# Patient Record
Sex: Male | Born: 1975 | Race: White | Hispanic: No | Marital: Single | Smoking: Current every day smoker
Health system: Southern US, Community
[De-identification: ages and names within clinical notes are randomized; demographics above are authoritative.]

## PROBLEM LIST (undated history)

## (undated) DIAGNOSIS — G43909 Migraine, unspecified, not intractable, without status migrainosus: Secondary | ICD-10-CM

## (undated) HISTORY — DX: Migraine, unspecified, not intractable, without status migrainosus: G43.909

---

## 1999-07-30 ENCOUNTER — Emergency Department (HOSPITAL_COMMUNITY): Admission: EM | Admit: 1999-07-30 | Discharge: 1999-07-30 | Payer: Self-pay

## 2013-02-18 ENCOUNTER — Telehealth: Payer: Self-pay

## 2013-02-18 NOTE — Telephone Encounter (Signed)
PT IN NEED OF HIS MEDICAL RECORDS, HOPING THEY WILL BE READY BY THE BEGINNING OF NEXT WEEK. PLEASE CALL [phone]

## 2013-02-19 NOTE — Telephone Encounter (Signed)
Pt only need his immunization records.

## 2013-02-20 NOTE — Telephone Encounter (Signed)
Spoke to patient and he only had a tetanus shot TDAP faxed to [employer] his [employer].

## 2014-09-17 ENCOUNTER — Ambulatory Visit (INDEPENDENT_AMBULATORY_CARE_PROVIDER_SITE_OTHER): Payer: 59 | Admitting: Family Medicine

## 2014-09-17 VITALS — BP 122/88 | HR 72 | Temp 97.9°F | Resp 18 | Ht 68.25 in | Wt 223.6 lb

## 2014-09-17 DIAGNOSIS — R05 Cough: Secondary | ICD-10-CM | POA: Diagnosis not present

## 2014-09-17 DIAGNOSIS — R059 Cough, unspecified: Secondary | ICD-10-CM

## 2014-09-17 DIAGNOSIS — J209 Acute bronchitis, unspecified: Secondary | ICD-10-CM | POA: Diagnosis not present

## 2014-09-17 MED ORDER — AZITHROMYCIN 250 MG PO TABS: ORAL_TABLET | ORAL | Status: DC

## 2014-09-17 MED ORDER — PREDNISONE 20 MG PO TABS: ORAL_TABLET | ORAL | Status: DC

## 2014-09-17 MED ORDER — BENZONATATE 100 MG PO CAPS: 100.0000 mg | ORAL_CAPSULE | Freq: Three times a day (TID) | ORAL | Status: DC | PRN

## 2014-09-17 MED ORDER — ALBUTEROL SULFATE (2.5 MG/3ML) 0.083% IN NEBU
2.5000 mg | INHALATION_SOLUTION | Freq: Once | RESPIRATORY_TRACT | Status: AC
  Administered 2014-09-17: 2.5 mg via RESPIRATORY_TRACT

## 2014-09-17 MED ORDER — HYDROCODONE-HOMATROPINE 5-1.5 MG/5ML PO SYRP: 5.0000 mL | ORAL_SOLUTION | ORAL | Status: DC | PRN

## 2014-09-17 MED ORDER — ALBUTEROL SULFATE HFA 108 (90 BASE) MCG/ACT IN AERS: 2.0000 | INHALATION_SPRAY | RESPIRATORY_TRACT | Status: DC | PRN

## 2014-09-17 NOTE — Progress Notes (Signed)
Cough Subjective:  Patient ID: Dustin Richard, male    DOB: 1975/03/18  Age: 39 y.o. MRN: [id number]  A couple of weeks ago the patient developed a respiratory tract infection. He developed congestion then developed more cough. Over the last week the cough is persisted as a tight cough, sometimes productive of a faintly greenish sputum, times clear. He does not smoke. He works as a Water quality scientist at [employer]. He has not had to miss work. It did start after he had been in the room of the patient to have a cough, but she was wearing a mask. He no longer smokes. He does not have asthma history, except for one time in the past having a episode of problems lately was treated with albuterol inhaler it sounds like and did well. Otherwise he is generally healthy. He is not on any chronic medications.   Objective:   Pleasant gentleman, a little overweight. Heavily tattooed. His TMs are normal. Throat clear. Neck supple without nodes or thyromegaly. Chest is clear to auscultation but has poor air exchange. Forced expiration he has wheezing and congestion sounds. Peak flow was measured and he performed at 300 with a predicted maximum of about 580  Assessment & Plan:   Assessment:  Cough, bronchospasm secondary to infection/bronchitis  Plan:  Albuterol nebulizer treatment, then decide treatment  After the treatment he did not feel the pressure on his chest and was feeling like care was moving better. His repeat peak flow 360 was significantly improved.  Patient Instructions  Use albuterol 2 inhalations every 2-4 hours  Take the Tessalon cough pills one or 2 pills 3 times daily as needed for daytime cough  Take the Hycodan cough syrup at nighttime 1 teaspoon every 4 hours as needed for cough. This is sedating so I don't recommend it when you're going to work.  Take the azithromycin 2 pills initially, then 1 daily for 4 days  Take the prednisone 3 pills daily for 2 days, then 2 daily for 2 days,  then 1 daily for 2 days. Best taken in the morning.     HOPPER,DAVID, MD 09/17/2014

## 2014-09-17 NOTE — Patient Instructions (Signed)
Use albuterol 2 inhalations every 2-4 hours  Take the Tessalon cough pills one or 2 pills 3 times daily as needed for daytime cough  Take the Hycodan cough syrup at nighttime 1 teaspoon every 4 hours as needed for cough. This is sedating so I don't recommend it when you're going to work.  Take the azithromycin 2 pills initially, then 1 daily for 4 days  Take the prednisone 3 pills daily for 2 days, then 2 daily for 2 days, then 1 daily for 2 days. Best taken in the morning.

## 2014-12-01 ENCOUNTER — Ambulatory Visit (INDEPENDENT_AMBULATORY_CARE_PROVIDER_SITE_OTHER): Payer: 59 | Admitting: Family Medicine

## 2014-12-01 VITALS — BP 168/100 | HR 74 | Temp 98.0°F | Resp 16 | Ht 69.0 in | Wt 231.0 lb

## 2014-12-01 DIAGNOSIS — F17210 Nicotine dependence, cigarettes, uncomplicated: Secondary | ICD-10-CM

## 2014-12-01 DIAGNOSIS — I10 Essential (primary) hypertension: Secondary | ICD-10-CM

## 2014-12-01 LAB — POCT UA - MICROSCOPIC ONLY

## 2014-12-01 LAB — POCT URINALYSIS DIPSTICK
Glucose, UA: NEGATIVE
Leukocytes, UA: NEGATIVE
Nitrite, UA: NEGATIVE
PH UA: 7
PROTEIN UA: 30
RBC UA: NEGATIVE
SPEC GRAV UA: 1.02
UROBILINOGEN UA: 1

## 2014-12-01 LAB — POCT GLYCOSYLATED HEMOGLOBIN (HGB A1C): HEMOGLOBIN A1C: 5.7

## 2014-12-01 LAB — HEMOGLOBIN A1C: Hgb A1c MFr Bld: 5.7 % (ref 4.0–6.0)

## 2014-12-01 MED ORDER — LISINOPRIL-HYDROCHLOROTHIAZIDE 10-12.5 MG PO TABS: 1.0000 | ORAL_TABLET | Freq: Every day | ORAL | Status: DC

## 2014-12-01 NOTE — Patient Instructions (Signed)
Smoking Cessation, Tips for Success If you are ready to quit smoking, congratulations! You have chosen to help yourself be healthier. Cigarettes bring nicotine, tar, carbon monoxide, and other irritants into your body. Your lungs, heart, and blood vessels will be able to work better without these poisons. There are many different ways to quit smoking. Nicotine gum, nicotine patches, a nicotine inhaler, or nicotine nasal spray can help with physical craving. Hypnosis, support groups, and medicines help break the habit of smoking. WHAT THINGS CAN I DO TO MAKE QUITTING EASIER?  Here are some tips to help you quit for good:  Pick a date when you will quit smoking completely. Tell all of your friends and family about your plan to quit on that date.  Do not try to slowly cut down on the number of cigarettes you are smoking. Pick a quit date and quit smoking completely starting on that day.  Throw away all cigarettes.   Clean and remove all ashtrays from your home, work, and car.  On a card, write down your reasons for quitting. Carry the card with you and read it when you get the urge to smoke.  Cleanse your body of nicotine. Drink enough water and fluids to keep your urine clear or pale yellow. Do this after quitting to flush the nicotine from your body.  Learn to predict your moods. Do not let a bad situation be your excuse to have a cigarette. Some situations in your life might tempt you into wanting a cigarette.  Never have "just one" cigarette. It leads to wanting another and another. Remind yourself of your decision to quit.  Change habits associated with smoking. If you smoked while driving or when feeling stressed, try other activities to replace smoking. Stand up when drinking your coffee. Brush your teeth after eating. Sit in a different chair when you read the paper. Avoid alcohol while trying to quit, and try to drink fewer caffeinated beverages. Alcohol and caffeine may urge you to  smoke.  Avoid foods and drinks that can trigger a desire to smoke, such as sugary or spicy foods and alcohol.  Ask people who smoke not to smoke around you.  Have something planned to do right after eating or having a cup of coffee. For example, plan to take a walk or exercise.  Try a relaxation exercise to calm you down and decrease your stress. Remember, you may be tense and nervous for the first 2 weeks after you quit, but this will pass.  Find new activities to keep your hands busy. Play with a pen, coin, or rubber band. Doodle or draw things on paper.  Brush your teeth right after eating. This will help cut down on the craving for the taste of tobacco after meals. You can also try mouthwash.   Use oral substitutes in place of cigarettes. Try using lemon drops, carrots, cinnamon sticks, or chewing gum. Keep them handy so they are available when you have the urge to smoke.  When you have the urge to smoke, try deep breathing.  Designate your home as a nonsmoking area.  If you are a heavy smoker, ask your health care provider about a prescription for nicotine chewing gum. It can ease your withdrawal from nicotine.  Reward yourself. Set aside the cigarette money you save and buy yourself something nice.  Look for support from others. Join a support group or smoking cessation program. Ask someone at home or at work to help you with your plan   to quit smoking.  Always ask yourself, "Do I need this cigarette or is this just a reflex?" Tell yourself, "Today, I choose not to smoke," or "I do not want to smoke." You are reminding yourself of your decision to quit.  Do not replace cigarette smoking with electronic cigarettes (commonly called e-cigarettes). The safety of e-cigarettes is unknown, and some may contain harmful chemicals.  If you relapse, do not give up! Plan ahead and think about what you will do the next time you get the urge to smoke. HOW WILL I FEEL WHEN I QUIT SMOKING? You  may have symptoms of withdrawal because your body is used to nicotine (the addictive substance in cigarettes). You may crave cigarettes, be irritable, feel very hungry, cough often, get headaches, or have difficulty concentrating. The withdrawal symptoms are only temporary. They are strongest when you first quit but will go away within 10-14 days. When withdrawal symptoms occur, stay in control. Think about your reasons for quitting. Remind yourself that these are signs that your body is healing and getting used to being without cigarettes. Remember that withdrawal symptoms are easier to treat than the major diseases that smoking can cause.  Even after the withdrawal is over, expect periodic urges to smoke. However, these cravings are generally short lived and will go away whether you smoke or not. Do not smoke! WHAT RESOURCES ARE AVAILABLE TO HELP ME QUIT SMOKING? Your health care provider can direct you to community resources or hospitals for support, which may include:  Group support.  Education.  Hypnosis.  Therapy.   This information is not intended to replace advice given to you by your health care provider. Make sure you discuss any questions you have with your health care provider.   Document Released: 09/17/2003 Document Revised: 01/09/2014 Document Reviewed: 06/06/2012 Elsevier Interactive Patient Education 2016 Elsevier Inc. Hypertension Hypertension, commonly called high blood pressure, is when the force of blood pumping through your arteries is too strong. Your arteries are the blood vessels that carry blood from your heart throughout your body. A blood pressure reading consists of a higher number over a lower number, such as 110/72. The higher number (systolic) is the pressure inside your arteries when your heart pumps. The lower number (diastolic) is the pressure inside your arteries when your heart relaxes. Ideally you want your blood pressure below 120/80. Hypertension forces  your heart to work harder to pump blood. Your arteries may become narrow or stiff. Having untreated or uncontrolled hypertension can cause heart attack, stroke, kidney disease, and other problems. RISK FACTORS Some risk factors for high blood pressure are controllable. Others are not.  Risk factors you cannot control include:   Race. You may be at higher risk if you are African American.  Age. Risk increases with age.  Gender. Men are at higher risk than women before age 45 years. After age 65, women are at higher risk than men. Risk factors you can control include:  Not getting enough exercise or physical activity.  Being overweight.  Getting too much fat, sugar, calories, or salt in your diet.  Drinking too much alcohol. SIGNS AND SYMPTOMS Hypertension does not usually cause signs or symptoms. Extremely high blood pressure (hypertensive crisis) may cause headache, anxiety, shortness of breath, and nosebleed. DIAGNOSIS To check if you have hypertension, your health care provider will measure your blood pressure while you are seated, with your arm held at the level of your heart. It should be measured at least twice using   the same arm. Certain conditions can cause a difference in blood pressure between your right and left arms. A blood pressure reading that is higher than normal on one occasion does not mean that you need treatment. If it is not clear whether you have high blood pressure, you may be asked to return on a different day to have your blood pressure checked again. Or, you may be asked to monitor your blood pressure at home for 1 or more weeks. TREATMENT Treating high blood pressure includes making lifestyle changes and possibly taking medicine. Living a healthy lifestyle can help lower high blood pressure. You may need to change some of your habits. Lifestyle changes may include:  Following the DASH diet. This diet is high in fruits, vegetables, and whole grains. It is low in  salt, red meat, and added sugars.  Keep your sodium intake below 2,300 mg per day.  Getting at least 30-45 minutes of aerobic exercise at least 4 times per week.  Losing weight if necessary.  Not smoking.  Limiting alcoholic beverages.  Learning ways to reduce stress. Your health care provider may prescribe medicine if lifestyle changes are not enough to get your blood pressure under control, and if one of the following is true:  You are 7318-40 years of age and your systolic blood pressure is above 140.  You are 860 years of age or older, and your systolic blood pressure is above 150.  Your diastolic blood pressure is above 90.  You have diabetes, and your systolic blood pressure is over 140 or your diastolic blood pressure is over 90.  You have kidney disease and your blood pressure is above 140/90.  You have heart disease and your blood pressure is above 140/90. Your personal target blood pressure may vary depending on your medical conditions, your age, and other factors. HOME CARE INSTRUCTIONS  Have your blood pressure rechecked as directed by your health care provider.   Take medicines only as directed by your health care provider. Follow the directions carefully. Blood pressure medicines must be taken as prescribed. The medicine does not work as well when you skip doses. Skipping doses also puts you at risk for problems.  Do not smoke.   Monitor your blood pressure at home as directed by your health care provider. SEEK MEDICAL CARE IF:   You think you are having a reaction to medicines taken.  You have recurrent headaches or feel dizzy.  You have swelling in your ankles.  You have trouble with your vision. SEEK IMMEDIATE MEDICAL CARE IF:  You develop a severe headache or confusion.  You have unusual weakness, numbness, or feel faint.  You have severe chest or abdominal pain.  You vomit repeatedly.  You have trouble breathing. MAKE SURE YOU:   Understand  these instructions.  Will watch your condition.  Will get help right away if you are not doing well or get worse.   This information is not intended to replace advice given to you by your health care provider. Make sure you discuss any questions you have with your health care provider.   Document Released: 12/19/2004 Document Revised: 05/05/2014 Document Reviewed: 10/11/2012 Elsevier Interactive Patient Education 2016 Elsevier Inc. DASH Eating Plan DASH stands for "Dietary Approaches to Stop Hypertension." The DASH eating plan is a healthy eating plan that has been shown to reduce high blood pressure (hypertension). Additional health benefits may include reducing the risk of type 2 diabetes mellitus, heart disease, and stroke. The DASH eating  plan may also help with weight loss. WHAT DO I NEED TO KNOW ABOUT THE DASH EATING PLAN? For the DASH eating plan, you will follow these general guidelines:  Choose foods with a percent daily value for sodium of less than 5% (as listed on the food label).  Use salt-free seasonings or herbs instead of table salt or sea salt.  Check with your health care provider or pharmacist before using salt substitutes.  Eat lower-sodium products, often labeled as "lower sodium" or "no salt added."  Eat fresh foods.  Eat more vegetables, fruits, and low-fat dairy products.  Choose whole grains. Look for the word "whole" as the first word in the ingredient list.  Choose fish and skinless chicken or [place] more often than red meat. Limit fish, poultry, and meat to 6 oz (170 g) each day.  Limit sweets, desserts, sugars, and sugary drinks.  Choose heart-healthy fats.  Limit cheese to 1 oz (28 g) per day.  Eat more home-cooked food and less restaurant, buffet, and fast food.  Limit fried foods.  Cook foods using methods other than frying.  Limit canned vegetables. If you do use them, rinse them well to decrease the sodium.  When eating at a restaurant,  ask that your food be prepared with less salt, or no salt if possible. WHAT FOODS CAN I EAT? Seek help from a dietitian for individual calorie needs. Grains Whole grain or whole wheat bread. Brown rice. Whole grain or whole wheat pasta. Quinoa, bulgur, and whole grain cereals. Low-sodium cereals. Corn or whole wheat flour tortillas. Whole grain cornbread. Whole grain crackers. Low-sodium crackers. Vegetables Fresh or frozen vegetables (raw, steamed, roasted, or grilled). Low-sodium or reduced-sodium tomato and vegetable juices. Low-sodium or reduced-sodium tomato sauce and paste. Low-sodium or reduced-sodium canned vegetables.  Fruits All fresh, canned (in natural juice), or frozen fruits. Meat and Other Protein Products Ground beef (85% or leaner), grass-fed beef, or beef trimmed of fat. Skinless chicken or [place]. Ground chicken or [place]. Pork trimmed of fat. All fish and seafood. Eggs. Dried beans, peas, or lentils. Unsalted nuts and seeds. Unsalted canned beans. Dairy Low-fat dairy products, such as skim or 1% milk, 2% or reduced-fat cheeses, low-fat ricotta or cottage cheese, or plain low-fat yogurt. Low-sodium or reduced-sodium cheeses. Fats and Oils Tub margarines without trans fats. Light or reduced-fat mayonnaise and salad dressings (reduced sodium). Avocado. Safflower, olive, or canola oils. Natural peanut or almond butter. Other Unsalted popcorn and pretzels. The items listed above may not be a complete list of recommended foods or beverages. Contact your dietitian for more options. WHAT FOODS ARE NOT RECOMMENDED? Grains White bread. White pasta. White rice. Refined cornbread. Bagels and croissants. Crackers that contain trans fat. Vegetables Creamed or fried vegetables. Vegetables in a cheese sauce. Regular canned vegetables. Regular canned tomato sauce and paste. Regular tomato and vegetable juices. Fruits Dried fruits. Canned fruit in light or heavy syrup. Fruit juice. Meat  and Other Protein Products Fatty cuts of meat. Ribs, chicken wings, bacon, sausage, bologna, salami, chitterlings, fatback, hot dogs, bratwurst, and packaged luncheon meats. Salted nuts and seeds. Canned beans with salt. Dairy Whole or 2% milk, cream, half-and-half, and cream cheese. Whole-fat or sweetened yogurt. Full-fat cheeses or blue cheese. Nondairy creamers and whipped toppings. Processed cheese, cheese spreads, or cheese curds. Condiments Onion and garlic salt, seasoned salt, table salt, and sea salt. Canned and packaged gravies. Worcestershire sauce. Tartar sauce. Barbecue sauce. Teriyaki sauce. Soy sauce, including reduced sodium. Steak sauce. Fish sauce.  Oyster sauce. Cocktail sauce. Horseradish. Ketchup and mustard. Meat flavorings and tenderizers. Bouillon cubes. Hot sauce. Tabasco sauce. Marinades. Taco seasonings. Relishes. Fats and Oils Butter, stick margarine, lard, shortening, ghee, and bacon fat. Coconut, palm kernel, or palm oils. Regular salad dressings. Other Pickles and olives. Salted popcorn and pretzels. The items listed above may not be a complete list of foods and beverages to avoid. Contact your dietitian for more information. WHERE CAN I FIND MORE INFORMATION? National Heart, Lung, and Blood Institute: [employer]   This information is not intended to replace advice given to you by your health care provider. Make sure you discuss any questions you have with your health care provider.   Document Released: 12/08/2010 Document Revised: 01/09/2014 Document Reviewed: 10/23/2012 Elsevier Interactive Patient Education [employer]

## 2014-12-01 NOTE — Progress Notes (Signed)
[facility] [address] [place] [address] [phone]- 0000  Date:  12/01/2014   Name:  Dustin Richard   DOB:  1975/09/13   MRN:  [id number]  PCP:  No primary care provider on file.    Chief Complaint: Hypertension   History of Present Illness:  Dustin Richard is a 39 y.o. 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  - Taking pseudophed several times a day for URI.  - Does snore. Does not know if apneic. No sleep study in the past.   Diet:  - 5-6 30oz waters a day for health more than thirst.  Thirsty - Sweet tea, powerade, gatorade 1x/day - Minimal caffeine - Trying to follow diet from a personal trainer.  Lots of vegetables and protein.  --lost 40 lbs since that point.   - 2 days a week, 2-3 hours per week, HIIT.   Currently smoking <1ppw.  Previously a heavy smoker.   There are no active problems to display for this patient.   Past Medical History  Diagnosis Date  . Migraine     History reviewed. No pertinent past surgical history.  Social History  Substance Use Topics  . Smoking status: Current Some Day Smoker  . Smokeless tobacco: None  . Alcohol Use: 2.4 - 4.2 oz/week    4-7 Standard drinks or equivalent per week    Family History  Problem Relation Age of Onset  . Diabetes Father   . Hyperlipidemia Father   . Cancer Maternal Grandmother   . Heart disease Maternal Grandfather   . Heart disease Paternal Grandmother   . Hyperlipidemia Paternal Grandmother     No Known Allergies  Medication list has been reviewed and updated.  Current Outpatient Prescriptions on File Prior to Visit  Medication Sig Dispense Refill  . albuterol (PROVENTIL HFA;VENTOLIN HFA) 108 (90 BASE) MCG/ACT inhaler Inhale 2 puffs into the lungs every 4 (four) hours as needed for  wheezing or shortness of breath (cough, shortness of breath or wheezing.). (Patient not taking: Reported on 12/01/2014) 1 Inhaler 1  . HYDROcodone-homatropine (HYCODAN) 5-1.5 MG/5ML syrup Take 5 mLs by mouth every 4 (four) hours as needed. (Patient not taking: Reported on 12/01/2014) 120 mL 0   No current facility-administered medications on file prior to visit.    Review of Systems:  Review of Systems  Constitutional: Positive for diaphoresis. Negative for fever, chills and malaise/fatigue.  Eyes: Negative for blurred vision.  Respiratory: Positive for hemoptysis. Negative for cough and shortness of breath.   Cardiovascular: Negative for chest pain and palpitations.  Gastrointestinal: Negative for nausea, vomiting, abdominal pain, diarrhea and constipation.  Musculoskeletal: Negative for myalgias and back pain.  Neurological: Positive for dizziness (standing).  Psychiatric/Behavioral: Negative for depression.    Physical Examination: Filed Vitals:   12/01/14 1624  BP: 168/100  Pulse: 74  Temp: 98 F (36.7 C)  Resp: 16   Filed Vitals:   12/01/14 1624  Height:  (1.753 m)  Weight: 231 lb (104.781 kg)   Body mass index is 34.1 kg/(m^2). Ideal Body Weight: Weight in (lb) to have BMI = 25: 168.9  GEN: WDWN, NAD, Non-toxic, A & O x 3 HEENT: Atraumatic, Normocephalic. Neck supple. No masses, No LAD. Ears and Nose: No external deformity. CV: RRR,  No M/G/R. No JVD. No thrill. No extra heart sounds. PULM: CTA B, no wheezes, crackles, rhonchi. No retractions. No resp. distress. No accessory muscle use. ABD: S, NT, ND, +BS. No rebound. No HSM. EXTR: No c/c/e NEURO Normal gait.  PSYCH: Normally interactive. Conversant. Not depressed or anxious appearing.  Calm demeanor.   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 Does have a family history of diabetes and heart disease as well as hyperlipidemia. Additionally he is a smoker, although he has cut back  quite a bit. He has been working out over the last several months and has lost  forty pounds last year. - A1 C 5.7 - Urine dip was unremarkable. - CMP and Lipid Panel sent. - Considering his relatively high BP westarted him on low dose losartan and HCTZ. Hewill continue to monitor his blood pressure. while at work & Come back for a follow-up in 2 weeks. - He was also provided a DASH diet handout.   Current Every day smoker: Discussed quitting and offered pharmaceutical aid or referral for CBT. He will follow up as needed.  He is sexually active and we decided to HIV screening.  Signed Guinevere ScarletBlake Ronnae Kaser, MD

## 2014-12-02 LAB — COMPREHENSIVE METABOLIC PANEL
ALK PHOS: 48 U/L (ref 40–115)
ALT: 54 U/L — AB (ref 9–46)
AST: 53 U/L — AB (ref 10–40)
Albumin: 4.7 g/dL (ref 3.6–5.1)
BILIRUBIN TOTAL: 1.1 mg/dL (ref 0.2–1.2)
BUN: 10 mg/dL (ref 7–25)
CALCIUM: 9.1 mg/dL (ref 8.6–10.3)
CO2: 28 mmol/L (ref 20–31)
Chloride: 100 mmol/L (ref 98–110)
Creat: 0.89 mg/dL (ref 0.60–1.35)
GLUCOSE: 103 mg/dL — AB (ref 65–99)
Potassium: 4.2 mmol/L (ref 3.5–5.3)
Sodium: 135 mmol/L (ref 135–146)
Total Protein: 7.1 g/dL (ref 6.1–8.1)

## 2014-12-02 LAB — HIV ANTIBODY (ROUTINE TESTING W REFLEX): HIV 1&2 Ab, 4th Generation: NONREACTIVE

## 2014-12-02 LAB — LIPID PANEL
CHOLESTEROL: 164 mg/dL (ref 125–200)
HDL: 56 mg/dL (ref >=40)
LDL Cholesterol: 85 mg/dL (ref <130)
Total CHOL/HDL Ratio: 2.9 Ratio (ref <=5.0)
Triglycerides: 117 mg/dL (ref <150)
VLDL: 23 mg/dL (ref <30)

## 2014-12-17 ENCOUNTER — Encounter: Payer: Self-pay | Admitting: Family Medicine

## 2015-01-06 MED FILL — LISINOPRIL-HCTZ 10-12.5 MG: 10-12.5 | 30 days supply | Qty: 30 | Fill #1

## 2015-02-10 ENCOUNTER — Ambulatory Visit (INDEPENDENT_AMBULATORY_CARE_PROVIDER_SITE_OTHER): Payer: 59 | Admitting: Physician Assistant

## 2015-02-10 VITALS — BP 142/92 | HR 91 | Temp 97.9°F | Resp 16 | Ht 68.0 in | Wt 219.0 lb

## 2015-02-10 DIAGNOSIS — R7303 Prediabetes: Secondary | ICD-10-CM

## 2015-02-10 DIAGNOSIS — R7309 Other abnormal glucose: Secondary | ICD-10-CM | POA: Diagnosis not present

## 2015-02-10 DIAGNOSIS — I10 Essential (primary) hypertension: Secondary | ICD-10-CM | POA: Diagnosis not present

## 2015-02-10 LAB — COMPLETE METABOLIC PANEL WITH GFR
ALT: 47 U/L — AB (ref 9–46)
AST: 48 U/L — AB (ref 10–40)
Albumin: 4.1 g/dL (ref 3.6–5.1)
Alkaline Phosphatase: 49 U/L (ref 40–115)
BUN: 9 mg/dL (ref 7–25)
CALCIUM: 9.3 mg/dL (ref 8.6–10.3)
CHLORIDE: 103 mmol/L (ref 98–110)
CO2: 25 mmol/L (ref 20–31)
CREATININE: 0.93 mg/dL (ref 0.60–1.35)
GFR, Est African American: >89 mL/min (ref >=60)
GFR, Est Non African American: >89 mL/min (ref >=60)
GLUCOSE: 94 mg/dL (ref 65–99)
POTASSIUM: 4.1 mmol/L (ref 3.5–5.3)
SODIUM: 139 mmol/L (ref 135–146)
Total Bilirubin: 1.1 mg/dL (ref 0.2–1.2)
Total Protein: 7.2 g/dL (ref 6.1–8.1)

## 2015-02-10 MED ORDER — LISINOPRIL-HYDROCHLOROTHIAZIDE 20-12.5 MG PO TABS: 1.0000 | ORAL_TABLET | Freq: Every day | ORAL | Status: DC

## 2015-02-10 MED FILL — LISINOPRIL-HCTZ 20-12.5 MG: 20-12.5 | 30 days supply | Qty: 30 | Fill #0

## 2015-02-10 NOTE — Patient Instructions (Addendum)
We are increasing your blood pressure medication today.   If this does not stay beneath the 140/90, we need to see you within a month. If this does, then I would like you to return in 6 months.  Keep up with your exercise, hydration, and appropriate diet.  You are doing awesome. I will have your lab results within 10 days.   You Can Quit Smoking You can contact the 1.800.QUI.TNOW  --THEY WILL OFFER DIFFERENT MODES TO QUIT SMOKING TO HELP START THE PROCESS.  If you are ready to quit smoking or are thinking about it, congratulations! You have chosen to help yourself be healthier and live longer! There are lots of different ways to quit smoking. Nicotine gum, nicotine patches, a nicotine inhaler, or nicotine nasal spray can help with physical craving. Hypnosis, support groups, and medicines help break the habit of smoking. TIPS TO GET OFF AND STAY OFF CIGARETTES  Learn to predict your moods. Do not let a bad situation be your excuse to have a cigarette. Some situations in your life might tempt you to have a cigarette.  Ask friends and co-workers not to smoke around you.  Make your home smoke-free.  Never have "just one" cigarette. It leads to wanting another and another. Remind yourself of your decision to quit.  On a card, make a list of your reasons for not smoking. Read it at least the same number of times a day as you have a cigarette. Tell yourself everyday, "I do not want to smoke. I choose not to smoke."  Ask someone at home or work to help you with your plan to quit smoking.  Have something planned after you eat or have a cup of coffee. Take a walk or get other exercise to perk you up. This will help to keep you from overeating.  Try a relaxation exercise to calm you down and decrease your stress. Remember, you may be tense and nervous the first two weeks after you quit. This will pass.  Find new activities to keep your hands busy. Play with a pen, coin, or rubber band. Doodle or  draw things on paper.  Brush your teeth right after eating. This will help cut down the craving for the taste of tobacco after meals. You can try mouthwash too.  Try gum, breath mints, or diet candy to keep something in your mouth. IF YOU SMOKE AND WANT TO QUIT:  Do not stock up on cigarettes. Never buy a carton. Wait until one pack is finished before you buy another.  Never carry cigarettes with you at work or at home.  Keep cigarettes as far away from you as possible. Leave them with someone else.  Never carry matches or a lighter with you.  Ask yourself, "Do I need this cigarette or is this just a reflex?"  Bet with someone that you can quit. Put cigarette money in a piggy bank every morning. If you smoke, you give up the money. If you do not smoke, by the end of the week, you keep the money.  Keep trying. It takes 21 days to change a habit!  Talk to your doctor about using medicines to help you quit. These include nicotine replacement gum, lozenges, or skin patches.   This information is not intended to replace advice given to you by your health care provider. Make sure you discuss any questions you have with your health care provider.   Document Released: 10/15/2008 Document Revised: 03/13/2011 Document Reviewed: 10/15/2008 Elsevier  Interactive Patient Education 2016 Elsevier Inc.  

## 2015-02-10 NOTE — Progress Notes (Signed)
[facility] [address] [place] [address] [phone]  Date:  02/10/2015   Name:  Dustin Richard   DOB:  [birth year]   MRN:  [id number]  PCP:  No primary care provider on file.    History of Present Illness:  Dustin Richard is a 40 y.o. male patient who presents to [facility] for medication recheck, and htn.    He takes it every morning at 8 am.  He has not missed any dosing.   His blood pressure has been 142/92.   Blood pressure has elevated to 160 systolic at work twice.    He is eating baked chicken, vegetables, grain.  Eats lean ground [place].  Exercising well. Continues to smoke.     There are no active problems to display for this patient.   Past Medical History  Diagnosis Date  . Migraine     History reviewed. No pertinent past surgical history.  Social History  Substance Use Topics  . Smoking status: Current Some Day Smoker  . Smokeless tobacco: Never Used  . Alcohol Use: 2.4 - 4.2 oz/week    4-7 Standard drinks or equivalent per week    Family History  Problem Relation Age of Onset  . Diabetes Father   . Hyperlipidemia Father   . Cancer Maternal Grandmother   . Heart disease Maternal Grandfather   . Heart disease Paternal Grandmother   . Hyperlipidemia Paternal Grandmother     No Known Allergies  Medication list has been reviewed and updated.  Current Outpatient Prescriptions on File Prior to Visit  Medication Sig Dispense Refill  . albuterol (PROVENTIL HFA;VENTOLIN HFA) 108 (90 BASE) MCG/ACT inhaler Inhale 2 puffs into the lungs every 4 (four) hours as needed for wheezing or shortness of breath (cough, shortness of breath or wheezing.). 1 Inhaler 1  . lisinopril-hydrochlorothiazide (ZESTORETIC) 10-12.5 MG tablet Take 1 tablet by mouth daily. 30 tablet 1   No current facility-administered medications on file prior to visit.    ROS ROS otherwise unremarkable unless listed above.  Physical Examination: BP 142/92 mmHg  Pulse  91  Temp(Src) 97.9 F (36.6 C) (Oral)  Resp 16  Ht  (1.727 m)  Wt 219 lb (99.338 kg)  BMI 33.31 kg/m2  SpO2 96% Ideal Body Weight: Weight in (lb) to have BMI = 25: 164.1 Wt Readings from Last 3 Encounters:  02/10/15 219 lb (99.338 kg)  12/01/14 231 lb (104.781 kg)  09/17/14 223 lb 9.6 oz (101.424 kg)    Physical Exam  Constitutional: He is oriented to person, place, and time. He appears well-developed and well-nourished. No distress.  HENT:  Head: Normocephalic and atraumatic.  Eyes: Conjunctivae and EOM are normal. Pupils are equal, round, and reactive to light.  Cardiovascular: Normal rate and regular rhythm.  Exam reveals no gallop and no friction rub.   No murmur heard. Pulses:      Carotid pulses are 2+ on the right side, and 2+ on the left side.      Radial pulses are 2+ on the right side, and 2+ on the left side.       Dorsalis pedis pulses are 2+ on the right side, and 2+ on the left side.  Pulmonary/Chest: Effort normal. No respiratory distress.  Neurological: He is alert and oriented to person, place, and time.  Skin: Skin is warm and dry. He is not diaphoretic.  [facility]iatric: [facility] and affect. His behavior is normal.  Results for orders placed or performed in visit on 02/10/15  COMPLETE METABOLIC PANEL WITH GFR  Result Value Ref Range   Sodium 139 135 - 146 mmol/L   Potassium 4.1 3.5 - 5.3 mmol/L   Chloride 103 98 - 110 mmol/L   CO2 25 20 - 31 mmol/L   Glucose, Bld 94 65 - 99 mg/dL   BUN 9 7 - 25 mg/dL   Creat 1.61 0.96 - 0.45 mg/dL   Total Bilirubin 1.1 0.2 - 1.2 mg/dL   Alkaline Phosphatase 49 40 - 115 U/L   AST 48 (H) 10 - 40 U/L   ALT 47 (H) 9 - 46 U/L   Total Protein 7.2 6.1 - 8.1 g/dL   Albumin 4.1 3.6 - 5.1 g/dL   Calcium 9.3 8.6 - 40.9 mg/dL   GFR, Est African American >89 >=60 mL/min   GFR, Est Non African American >89 >=60 mL/min  Hemoglobin A1c  Result Value Ref Range   Hgb A1c MFr Bld 5.8 (H) <5.7 %   Mean Plasma  Glucose 120 (H) <117 mg/dL     Assessment and Plan: Dustin Richard is a 40 y.o. male who is here today for elevated HTN, and a1c check. rediabetic at this time.  Ace-I covered.  Increasing bp med to 20 from 10 of the lisinopril.   If this does not stay beneath the 140/90, return in 1 month If this does, then I would like you to return in 6 months.  advised exercise, hydration, and appropriate diet.  Advised smoking cessation. I will have your lab results within 10 days.  Essential hypertension - Plan: lisinopril-hydrochlorothiazide (PRINZIDE,ZESTORETIC) 20-12.5 MG tablet, COMPLETE METABOLIC PANEL WITH GFR  Prediabetes - Plan: Hemoglobin A1c  Trena Platt, PA-C [facility] 2/10/20176:54 AM

## 2015-02-11 LAB — HEMOGLOBIN A1C
Hgb A1c MFr Bld: 5.8 % — ABNORMAL HIGH (ref <5.7)
Mean Plasma Glucose: 120 mg/dL — ABNORMAL HIGH (ref <117)

## 2015-03-12 MED FILL — LISINOPRIL-HCTZ 20-12.5 MG: 20-12.5 | 90 days supply | Qty: 90 | Fill #1

## 2015-04-09 ENCOUNTER — Encounter: Payer: Self-pay | Admitting: *Deleted

## 2015-04-09 ENCOUNTER — Ambulatory Visit (INDEPENDENT_AMBULATORY_CARE_PROVIDER_SITE_OTHER): Payer: 59 | Admitting: Physician Assistant

## 2015-04-09 VITALS — BP 132/80 | HR 87 | Temp 98.0°F | Resp 16 | Ht 68.0 in | Wt 210.0 lb

## 2015-04-09 DIAGNOSIS — F411 Generalized anxiety disorder: Secondary | ICD-10-CM | POA: Diagnosis not present

## 2015-04-09 DIAGNOSIS — R251 Tremor, unspecified: Secondary | ICD-10-CM

## 2015-04-09 DIAGNOSIS — F419 Anxiety disorder, unspecified: Secondary | ICD-10-CM | POA: Diagnosis not present

## 2015-04-09 LAB — TSH: TSH: 1.56 mIU/L (ref 0.40–4.50)

## 2015-04-09 MED ORDER — HYDROXYZINE HCL 25 MG PO TABS: 25.0000 mg | ORAL_TABLET | Freq: Three times a day (TID) | ORAL | Status: DC | PRN

## 2015-04-09 MED ORDER — PROPRANOLOL HCL ER 60 MG PO CP24: 60.0000 mg | ORAL_CAPSULE | Freq: Every day | ORAL | Status: DC

## 2015-04-09 MED FILL — hydrOXYzine HCL 25 MG TABS: 25 | 10 days supply | Qty: 120 | Fill #0

## 2015-04-09 MED FILL — PROPRANOLOL ER 60 MG CAP: 60 | 30 days supply | Qty: 30 | Fill #0

## 2015-04-09 NOTE — Patient Instructions (Addendum)
IF you received an x-ray today, you will receive an invoice from [facility]. Please contact [facility] at [phone] with questions or concerns regarding your invoice.   IF you received labwork today, you will receive an invoice from [employer]. Please contact [facility] at [phone] with questions or concerns regarding your invoice.   Our billing staff will not be able to assist you with questions regarding bills from these companies.  You will be contacted with the lab results as soon as they are available. The fastest way to get your results is to activate your My Chart account. Instructions are located on the last page of this paperwork. If you have not heard from [place] regarding the results in 2 weeks, please contact this office.    Please take the propranolol daily.  We will recheck this on Sunday.  You can do 1-4 tablets three times per day as needed.  Please read the side effects.   Hydroxyzine capsules or tablets What is this medicine? HYDROXYZINE (hye DROX i zeen) is an antihistamine. This medicine is used to treat allergy symptoms. It is also used to treat anxiety and tension. This medicine can be used with other medicines to induce sleep before surgery. This medicine may be used for other purposes; ask your health care provider or pharmacist if you have questions. What should I tell my health care provider before I take this medicine? They need to know if you have any of these conditions: -any chronic illness -difficulty passing urine -glaucoma -heart disease -kidney disease -liver disease -lung disease -an unusual or allergic reaction to hydroxyzine, cetirizine, other medicines, foods, dyes, or preservatives -pregnant or trying to get pregnant -breast-feeding How should I use this medicine? Take this medicine by mouth with a full glass of water. Follow the directions on the prescription label. You may take this medicine  with food or on an empty stomach. Take your medicine at regular intervals. Do not take your medicine more often than directed. Talk to your pediatrician regarding the use of this medicine in children. Special care may be needed. While this drug may be prescribed for children as young as 66 years of age for selected conditions, precautions do apply. Patients over 15 years old may have a stronger reaction and need a smaller dose. Overdosage: If you think you have taken too much of this medicine contact a poison control center or emergency room at once. NOTE: This medicine is only for you. Do not share this medicine with others. What if I miss a dose? If you miss a dose, take it as soon as you can. If it is almost time for your next dose, take only that dose. Do not take double or extra doses. What may interact with this medicine? -alcohol -barbiturate medicines for sleep or seizures -medicines for colds, allergies -medicines for depression, anxiety, or emotional disturbances -medicines for pain -medicines for sleep -muscle relaxants This list may not describe all possible interactions. Give your health care provider a list of all the medicines, herbs, non-prescription drugs, or dietary supplements you use. Also tell them if you smoke, drink alcohol, or use illegal drugs. Some items may interact with your medicine. What should I watch for while using this medicine? Tell your doctor or health care professional if your symptoms do not improve. You may get drowsy or dizzy. Do not drive, use machinery, or do anything that needs mental alertness until you know how this medicine affects you. Do  not stand or sit up quickly, especially if you are an older patient. This reduces the risk of dizzy or fainting spells. Alcohol may interfere with the effect of this medicine. Avoid alcoholic drinks. Your mouth may get dry. Chewing sugarless gum or sucking hard candy, and drinking plenty of water may help. Contact  your doctor if the problem does not go away or is severe. This medicine may cause dry eyes and blurred vision. If you wear contact lenses you may feel some discomfort. Lubricating drops may help. See your eye doctor if the problem does not go away or is severe. If you are receiving skin tests for allergies, tell your doctor you are using this medicine. What side effects may I notice from receiving this medicine? Side effects that you should report to your doctor or health care professional as soon as possible: -fast or irregular heartbeat -difficulty passing urine -seizures -slurred speech or confusion -tremor Side effects that usually do not require medical attention (report to your doctor or health care professional if they continue or are bothersome): -constipation -drowsiness -fatigue -headache -stomach upset This list may not describe all possible side effects. Call your doctor for medical advice about side effects. You may report side effects to FDA at 1-800-FDA-1088. Where should I keep my medicine? Keep out of the reach of children. Store at room temperature between 15 and 30 degrees C (59 and 86 degrees F). Keep container tightly closed. Throw away any unused medicine after the expiration date. NOTE: This sheet is a summary. It may not cover all possible information. If you have questions about this medicine, talk to your doctor, pharmacist, or health care provider.    2016, Elsevier/Gold Standard. (2007-05-03 14:50:59) Propranolol tablets What is this medicine? PROPRANOLOL (proe PRAN oh lole) is a beta-blocker. Beta-blockers reduce the workload on the heart and help it to beat more regularly. This medicine is used to treat high blood pressure, to control irregular heart rhythms (arrhythmias) and to relieve chest pain caused by angina. It may also be helpful after a heart attack. This medicine is also used to prevent migraine headaches, relieve uncontrollable shaking (tremors), and  help certain problems related to the thyroid gland and adrenal gland. This medicine may be used for other purposes; ask your health care provider or pharmacist if you have questions. What should I tell my health care provider before I take this medicine? They need to know if you have any of these conditions: -circulation problems or blood vessel disease -diabetes -history of heart attack or heart disease, vasospastic angina -kidney disease -liver disease -lung or breathing disease, like asthma or emphysema -pheochromocytoma -slow heart rate -thyroid disease -an unusual or allergic reaction to propranolol, other beta-blockers, medicines, foods, dyes, or preservatives -pregnant or trying to get pregnant -breast-feeding How should I use this medicine? Take this medicine by mouth with a glass of water. Follow the directions on the prescription label. Take your doses at regular intervals. Do not take your medicine more often than directed. Do not stop taking except on your the advice of your doctor or health care professional. Talk to your pediatrician regarding the use of this medicine in children. Special care may be needed. Overdosage: If you think you have taken too much of this medicine contact a poison control center or emergency room at once. NOTE: This medicine is only for you. Do not share this medicine with others. What if I miss a dose? If you miss a dose, take it as soon  as you can. If it is almost time for your next dose, take only that dose. Do not take double or extra doses. What may interact with this medicine? Do not take this medicine with any of the following medications: -feverfew -phenothiazines like chlorpromazine, mesoridazine, prochlorperazine, thioridazine This medicine may also interact with the following medications: -aluminum hydroxide gel -antipyrine -antiviral medicines for HIV or AIDS -barbiturates like phenobarbital -certain medicines for blood pressure,  heart disease, irregular heart beat -cimetidine -ciprofloxacin -diazepam -fluconazole -haloperidol -isoniazid -medicines for cholesterol like cholestyramine or colestipol -medicines for mental depression -medicines for migraine headache like almotriptan, eletriptan, frovatriptan, naratriptan, rizatriptan, sumatriptan, zolmitriptan -NSAIDs, medicines for pain and inflammation, like ibuprofen or naproxen -phenytoin -rifampin -teniposide -theophylline -thyroid medicines -tolbutamide -warfarin -zileuton This list may not describe all possible interactions. Give your health care provider a list of all the medicines, herbs, non-prescription drugs, or dietary supplements you use. Also tell them if you smoke, drink alcohol, or use illegal drugs. Some items may interact with your medicine. What should I watch for while using this medicine? Visit your doctor or health care professional for regular check ups. Check your blood pressure and pulse rate regularly. Ask your health care professional what your blood pressure and pulse rate should be, and when you should contact them. You may get drowsy or dizzy. Do not drive, use machinery, or do anything that needs mental alertness until you know how this drug affects you. Do not stand or sit up quickly, especially if you are an older patient. This reduces the risk of dizzy or fainting spells. Alcohol can make you more drowsy and dizzy. Avoid alcoholic drinks. This medicine can affect blood sugar levels. If you have diabetes, check with your doctor or health care professional before you change your diet or the dose of your diabetic medicine. Do not treat yourself for coughs, colds, or pain while you are taking this medicine without asking your doctor or health care professional for advice. Some ingredients may increase your blood pressure. What side effects may I notice from receiving this medicine? Side effects that you should report to your doctor or  health care professional as soon as possible: -allergic reactions like skin rash, itching or hives, swelling of the face, lips, or tongue -breathing problems -changes in blood sugar -cold hands or feet -difficulty sleeping, nightmares -dry peeling skin -hallucinations -muscle cramps or weakness -slow heart rate -swelling of the legs and ankles -vomiting Side effects that usually do not require medical attention (report to your doctor or health care professional if they continue or are bothersome): -change in sex drive or performance -diarrhea -dry sore eyes -hair loss -nausea -weak or tired This list may not describe all possible side effects. Call your doctor for medical advice about side effects. You may report side effects to FDA at 1-800-FDA-1088. Where should I keep my medicine? Keep out of the reach of children. Store at room temperature between 15 and 30 degrees C (59 and 86 degrees F). Protect from light. Throw away any unused medicine after the expiration date. NOTE: This sheet is a summary. It may not cover all possible information. If you have questions about this medicine, talk to your doctor, pharmacist, or health care provider.    2016, Elsevier/Gold Standard. (2012-08-23 14:51:53)

## 2015-04-11 NOTE — Progress Notes (Signed)
[facility] [address] [place] [address] [phone]  Date:  04/09/2015   Name:  Dustin Richard   DOB:  [birth year]   MRN:  [id number]  PCP:  No primary care provider on file.    History of Present Illness:  Dustin Richard is a 40 y.o. male patient who presents to [facility] for cc of tremulousness.  Patient reports a couple months of tremulousness.  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  In the last week, he had to let someone else draw the blood, due to hsi tremuloousness.  This is intermittent, and may last 20-25 minutes.  Generally happens at work.  It is uncontrolled and worsens with activity.   He has a hx of anxiety, and notes that he feels very anxious at his work.  Especially when his managers come to visit him.  He reports that it is high intensity/volume, and he is increasingly nervous at work.  He generally is not anxious with introduction to new patients.  His anxiety will include hot, sweaty palms.  He has had one of episode of dizziness associated.  He is currently taking bp medicine.   He denies depressed mood of loss of interest, appetite, guilt, si/hi, or sleep issues.   Currently lives at home, fairly quiet and stress free--with parents.   etOH use is about 4-5 beers and 4-5 shots, 2 days a week (saturday and Sunday) He notes improvement with this since last year.  He was drinking heavily almost daily.  He states that he chose that he needed to slow down. No heavy caffeine use.   No cannabis use or illicit drug use.     There are no active problems to display for this patient.   Past Medical History  Diagnosis Date  . Migraine     History reviewed. No pertinent past surgical history.  Social History  Substance Use Topics  . Smoking status: Current Some Day Smoker  . Smokeless tobacco: Never Used  . Alcohol Use: 2.4 - 4.2 oz/week    4-7 Standard drinks or equivalent per week    Family  History  Problem Relation Age of Onset  . Diabetes Father   . Hyperlipidemia Father   . Cancer Maternal Grandmother   . Heart disease Maternal Grandfather   . Heart disease Paternal Grandmother   . Hyperlipidemia Paternal Grandmother     No Known Allergies  Medication list has been reviewed and updated.  Current Outpatient Prescriptions on File Prior to Visit  Medication Sig Dispense Refill  . albuterol (PROVENTIL HFA;VENTOLIN HFA) 108 (90 BASE) MCG/ACT inhaler Inhale 2 puffs into the lungs every 4 (four) hours as needed for wheezing or shortness of breath (cough, shortness of breath or wheezing.). 1 Inhaler 1  . lisinopril-hydrochlorothiazide (PRINZIDE,ZESTORETIC) 20-12.5 MG tablet Take 1 tablet by mouth daily. 30 tablet 5   No current facility-administered medications on file prior to visit.    ROS ROS otherwise unremarkable unless listed above.   Physical Examination: BP 132/80 mmHg  Pulse 87  Temp(Src) 98 F (36.7 C) (Oral)  Resp 16  Ht  (1.727 m)  Wt 210 lb (95.255 kg)  BMI 31.94 kg/m2  SpO2 98% Ideal Body Weight: Weight in (lb) to have BMI = 25: 164.1  Physical Exam  Constitutional: He is oriented to person, place, and time. He appears well-developed and well-nourished. No distress.  HENT:  Head:  Normocephalic and atraumatic.  Eyes: Conjunctivae and EOM are normal. Pupils are equal, round, and reactive to light.  Cardiovascular: Normal rate.   Pulmonary/Chest: Effort normal. No respiratory distress.  Neurological: He is alert and oriented to person, place, and time.  Skin: Skin is warm and dry. He is not diaphoretic.  Psychiatric: He has a normal mood and affect. His behavior is normal.     Assessment and Plan: [name] is a 40 y.o. male who is here today for tremulousness. --at this time, he is not able to perform his work.  We will attempt vistaril and propranolol at this time.  This is likely anxety.  I will see if the beta-blocker improves his  symptoms.  We will follow up in 5 days.  We will then consider an SSRI at that time.  Will hold off on benzodiazepine due to his substance abuse.   Test thyroid at this time.   1. Tremor - propranolol ER (INDERAL LA) 60 MG 24 hr capsule; Take 1 capsule (60 mg total) by mouth daily.  Dispense: 30 capsule; Refill: 1 - hydrOXYzine (ATARAX/VISTARIL) 25 MG tablet; Take 1-4 tablets (25-100 mg total) by mouth 3 (three) times daily as needed.  Dispense: 120 tablet; Refill: 1 - TSH  2. Anxiety state - hydrOXYzine (ATARAX/VISTARIL) 25 MG tablet; Take 1-4 tablets (25-100 mg total) by mouth 3 (three) times daily as needed.  Dispense: 120 tablet; Refill: 1 - TSH   Trena PlattStephanie Nely Dedmon, PA-C [facility] 04/11/2015 8:42 AM

## 2015-04-13 ENCOUNTER — Ambulatory Visit (INDEPENDENT_AMBULATORY_CARE_PROVIDER_SITE_OTHER): Payer: 59 | Admitting: Internal Medicine

## 2015-04-13 ENCOUNTER — Ambulatory Visit: Payer: 59

## 2015-04-13 ENCOUNTER — Telehealth: Payer: Self-pay

## 2015-04-13 VITALS — BP 138/82 | HR 67 | Temp 98.0°F | Resp 18 | Ht 68.0 in | Wt 212.0 lb

## 2015-04-13 DIAGNOSIS — F411 Generalized anxiety disorder: Secondary | ICD-10-CM

## 2015-04-13 DIAGNOSIS — R251 Tremor, unspecified: Secondary | ICD-10-CM | POA: Diagnosis not present

## 2015-04-13 DIAGNOSIS — G250 Essential tremor: Secondary | ICD-10-CM

## 2015-04-13 MED ORDER — FLUOXETINE HCL 20 MG PO TABS: 20.0000 mg | ORAL_TABLET | Freq: Every day | ORAL | Status: DC

## 2015-04-13 MED ORDER — PROPRANOLOL HCL ER 80 MG PO CP24: 80.0000 mg | ORAL_CAPSULE | Freq: Every day | ORAL | Status: DC

## 2015-04-13 MED FILL — PROPRANOLOL ER 80 MG CAP: 80 | 30 days supply | Qty: 30 | Fill #0

## 2015-04-13 MED FILL — FLUoxetine HCL 20 MG TABS: 20 | 30 days supply | Qty: 30 | Fill #0

## 2015-04-13 NOTE — Progress Notes (Signed)
c 

## 2015-04-13 NOTE — Telephone Encounter (Signed)
Patient needs FMLA forms completed by Dustin Richard from his OV on 04/09/15 for tremors, I have completed what I could from the OV notes and highlighted the areas that need to be finished, I will place these forms in your box on 04/13/15 in a teal folder. Please return to the FMLA/Disability box at the 102 checkout desk within 5-7 business days. Thank you

## 2015-04-13 NOTE — Patient Instructions (Addendum)
IF you received an x-ray today, you will receive an invoice from [facility]. Please contact [facility] at [phone] with questions or concerns regarding your invoice.   IF you received labwork today, you will receive an invoice from [facility]. Please contact [facility] at [phone] with questions or concerns regarding your invoice.   Our billing staff will not be able to assist you with questions regarding bills from these companies.  You will be contacted with the lab results as soon as they are available. The fastest way to get your results is to activate your My Chart account. Instructions are located on the last page of this paperwork. If you have not heard from us regarding the results in 2 weeks, please contact this office.    Please refrain from heavy drinking at this time.  This could be somewhat of a catalyst to your anxiety.  You will start with half tablet daily of the prozac for 1 week and increase to 1 tablet daily ongoing.   I would like you to follow up with me in 4-6 weeks regarding the start of the Prozac. You can continue the use of the atarax.   We are increasing the dose of the propranol to 80mg .  Please discontinue the 60mg  tablet.  Fluoxetine capsules or tablets (Depression/Mood Disorders) What is this medicine? FLUOXETINE (floo OX e teen) belongs to a class of drugs known as selective serotonin reuptake inhibitors (SSRIs). It helps to treat mood problems such as depression, obsessive compulsive disorder, and panic attacks. It can also treat certain eating disorders. This medicine may be used for other purposes; ask your health care provider or pharmacist if you have questions. What should I tell my health care provider before I take this medicine? They need to know if you have any of these conditions: -bipolar disorder or mania -diabetes -glaucoma -liver disease -psychosis -seizures -suicidal thoughts or  history of attempted suicide -an unusual or allergic reaction to fluoxetine, other medicines, foods, dyes, or preservatives -pregnant or trying to get pregnant -breast-feeding How should I use this medicine? Take this medicine by mouth with a glass of water. Follow the directions on the prescription label. You can take this medicine with or without food. Take your medicine at regular intervals. Do not take it more often than directed. Do not stop taking this medicine suddenly except upon the advice of your doctor. Stopping this medicine too quickly may cause serious side effects or your condition may worsen. A special MedGuide will be given to you by the pharmacist with each prescription and refill. Be sure to read this information carefully each time. Talk to your pediatrician regarding the use of this medicine in children. While this drug may be prescribed for children as young as 7 years for selected conditions, precautions do apply. Overdosage: If you think you have taken too much of this medicine contact a poison control center or emergency room at once. NOTE: This medicine is only for you. Do not share this medicine with others. What if I miss a dose? If you miss a dose, skip the missed dose and go back to your regular dosing schedule. Do not take double or extra doses. What may interact with this medicine? Do not take fluoxetine with any of the following medications: -other medicines containing fluoxetine, like Sarafem or Symbyax -cisapride -linezolid -MAOIs like Carbex, Eldepryl, Marplan, Nardil, and Parnate -methylene blue (injected into a vein) -pimozide -thioridazine This medicine may also interact with  the following medications: -alcohol -aspirin and aspirin-like medicines -carbamazepine -certain medicines for depression, anxiety, or psychotic disturbances -certain medicines for migraine headaches like almotriptan, eletriptan, frovatriptan, naratriptan, rizatriptan, sumatriptan,  zolmitriptan -digoxin -diuretics -fentanyl -flecainide -furazolidone -isoniazid -lithium -medicines for sleep -medicines that treat or prevent blood clots like warfarin, enoxaparin, and dalteparin -NSAIDs, medicines for pain and inflammation, like ibuprofen or naproxen -phenytoin -procarbazine -propafenone -rasagiline -ritonavir -supplements like St. John's wort, kava kava, valerian -tramadol -tryptophan -vinblastine This list may not describe all possible interactions. Give your health care provider a list of all the medicines, herbs, non-prescription drugs, or dietary supplements you use. Also tell them if you smoke, drink alcohol, or use illegal drugs. Some items may interact with your medicine. What should I watch for while using this medicine? Tell your doctor if your symptoms do not get better or if they get worse. Visit your doctor or health care professional for regular checks on your progress. Because it may take several weeks to see the full effects of this medicine, it is important to continue your treatment as prescribed by your doctor. Patients and their families should watch out for new or worsening thoughts of suicide or depression. Also watch out for sudden changes in feelings such as feeling anxious, agitated, panicky, irritable, hostile, aggressive, impulsive, severely restless, overly excited and hyperactive, or not being able to sleep. If this happens, especially at the beginning of treatment or after a change in dose, call your health care professional. Bonita Quin may get drowsy or dizzy. Do not drive, use machinery, or do anything that needs mental alertness until you know how this medicine affects you. Do not stand or sit up quickly, especially if you are an older patient. This reduces the risk of dizzy or fainting spells. Alcohol may interfere with the effect of this medicine. Avoid alcoholic drinks. Your mouth may get dry. Chewing sugarless gum or sucking hard candy, and  drinking plenty of water may help. Contact your doctor if the problem does not go away or is severe. This medicine may affect blood sugar levels. If you have diabetes, check with your doctor or health care professional before you change your diet or the dose of your diabetic medicine. What side effects may I notice from receiving this medicine? Side effects that you should report to your doctor or health care professional as soon as possible: -allergic reactions like skin rash, itching or hives, swelling of the face, lips, or tongue -breathing problems -confusion -eye pain, changes in vision -fast or irregular heart rate, palpitations -flu-like fever, chills, cough, muscle or joint aches and pains -seizures -suicidal thoughts or other mood changes -swelling or redness in or around the eye -tremors -trouble sleeping -unusual bleeding or bruising -unusually tired or weak -vomiting Side effects that usually do not require medical attention (report to your doctor or health care professional if they continue or are bothersome): -change in sex drive or performance -diarrhea -dry mouth -flushing -headache -increased or decreased appetite -nausea -sweating This list may not describe all possible side effects. Call your doctor for medical advice about side effects. You may report side effects to FDA at 1-800-FDA-1088. Where should I keep my medicine? Keep out of the reach of children. Store at room temperature between 15 and 30 degrees C (59 and 86 degrees F). Throw away any unused medicine after the expiration date. NOTE: This sheet is a summary. It may not cover all possible information. If you have questions about this medicine, talk to your doctor,  pharmacist, or health care provider.    2016, Elsevier/Gold Standard. (2013-12-12 12:40:07)

## 2015-04-14 NOTE — Progress Notes (Signed)
[facility] [address], [place] [address] [phone]  Date:  04/13/2015   Name:  Dustin CalenderMatthew W Favor   DOB:  08/24/1975   MRN:  [id number]  PCP:  No PCP Per Patient    History of Present Illness:  Dustin CalenderMatthew W Richard is a 40 y.o. male patient who [facility].  He states that the tremulousness has improved, however he still has the tremors.  He is taking the inderal 60mg  XR prescribed 3 days ago.  He is also taking the atarax.  He only feels a side effect of mild drowsiness.  He has been using 100mg  of the atarax.   Patient works as a Water quality scientistphlebotomist.  He states his anxiety and tremulousness approaches when he thinks about having to return to work with this tremulousness and it starts up again. [facility].  This was 2/3 days during the weekend where he engaged in 5 beers and 4-5 shots each day.      Patient Active Problem List   Diagnosis Date Noted  . Morbid obesity (HCC) 04/13/2015    Past Medical History  Diagnosis Date  . Migraine     No past surgical history on file.  Social History  Substance Use Topics  . Smoking status: Current Some Day Smoker  . Smokeless tobacco: Never Used  . Alcohol Use: 2.4 - 4.2 oz/week    4-7 Standard drinks or equivalent per week    Family History  Problem Relation Age of Onset  . Diabetes Father   . Hyperlipidemia Father   . Cancer Maternal Grandmother   . Heart disease Maternal Grandfather   . Heart disease Paternal Grandmother   . Hyperlipidemia Paternal Grandmother     No Known Allergies  Medication list has been reviewed and updated.  Current Outpatient Prescriptions on File Prior to Visit  Medication Sig Dispense Refill  . hydrOXYzine (ATARAX/VISTARIL) 25 MG tablet Take 1-4 tablets (25-100 mg total) by mouth 3 (three) times daily as needed. 120 tablet 1  . lisinopril-hydrochlorothiazide (PRINZIDE,ZESTORETIC) 20-12.5 MG tablet Take 1 tablet by mouth daily. 30 tablet 5   . albuterol (PROVENTIL HFA;VENTOLIN HFA) 108 (90 BASE) MCG/ACT inhaler Inhale 2 puffs into the lungs every 4 (four) hours as needed for wheezing or shortness of breath (cough, shortness of breath or wheezing.). (Patient not taking: Reported on 04/13/2015) 1 Inhaler 1   No current facility-administered medications on file prior to visit.    ROS ROS otherwise unremarkable unless listed above.   Physical Examination: BP 138/82 mmHg  Pulse 67  Temp(Src) 98 F (36.7 C) (Oral)  Resp 18  Ht 5\' 8"  (1.727 m)  Wt 212 lb (96.163 kg)  BMI 32.24 kg/m2  SpO2 97% Ideal Body Weight: Weight in (lb) to have BMI = 25: 164.1  Physical Exam  Constitutional: He is oriented to person, place, and time. He appears well-developed and well-nourished. No distress.  HENT:  Head: Normocephalic and atraumatic.  Eyes: Conjunctivae and EOM are normal. Pupils are equal, round, and reactive to light.  Cardiovascular: Normal rate and regular rhythm.  Exam reveals no gallop and no friction rub.   No murmur heard. Pulses:      Radial pulses are 2+ on the right side.  Pulmonary/Chest: Effort normal. No respiratory distress.  Neurological: He is alert and oriented to person, place, and time. He displays tremor (tremulous that is pronounced with activities ). He displays no atrophy. No  cranial nerve deficit. He exhibits normal muscle tone.  Skin: Skin is warm and dry. He is not diaphoretic.  Psychiatric: He has a normal mood and affect. His behavior is normal.     Assessment and Plan: Dustin Richard is a 40 y.o. male who is here today for tremulousness and anxiety follow up. At this time, his sxs while improved are not to standard for skilled profession at this time.  i will increase the inderal to  24 hr capsule.   Will start prozac at this time.  Advised half tablet to start 1 week, and increase to full tablet daily for duration. Benzodiazepines appear necessary for him at this time.  However, I am concerned  with his etOH and addictive behaviors.  At this time, I will refer to psych eval at this time. Will complete forms for fmla at this time. Essential tremor - Plan: propranolol ER (INDERAL LA) 80 MG 24 hr capsule  Anxiety state - Plan: FLUoxetine (PROZAC) 20 MG tablet, Ambulatory referral to Psychiatry  Tremulousness - Plan: Ambulatory referral to Psychiatry   Trena Platt, PA-C [facility] 04/14/2015 8:40 AM    I have participated in the care of this patient with the Advanced Practice Provider and agree with Diagnosis and Plan as documented. Robert P. Merla Riches, M.D.

## 2015-04-17 NOTE — Telephone Encounter (Signed)
Forms completed.  Placed in fmla box today, 04/17/2015, 12:30pm.  Please make sure the info is correct, as I have had to make changes.

## 2015-04-19 ENCOUNTER — Ambulatory Visit (INDEPENDENT_AMBULATORY_CARE_PROVIDER_SITE_OTHER): Payer: 59 | Admitting: Physician Assistant

## 2015-04-19 VITALS — BP 126/70 | HR 70 | Temp 97.6°F | Resp 18 | Ht 68.0 in | Wt 211.0 lb

## 2015-04-19 DIAGNOSIS — F411 Generalized anxiety disorder: Secondary | ICD-10-CM

## 2015-04-19 DIAGNOSIS — R251 Tremor, unspecified: Secondary | ICD-10-CM | POA: Diagnosis not present

## 2015-04-19 MED ORDER — HYDROXYZINE HCL 25 MG PO TABS: 25.0000 mg | ORAL_TABLET | Freq: Three times a day (TID) | ORAL | Status: DC | PRN

## 2015-04-19 MED FILL — hydrOXYzine HCL 25 MG TABS: 25 | 10 days supply | Qty: 120 | Fill #1

## 2015-04-19 NOTE — Telephone Encounter (Signed)
Paperwork scanned and faxed to company on 04/19/15

## 2015-04-19 NOTE — Patient Instructions (Addendum)
     IF you received an x-ray today, you will receive an invoice from [facility]. Please contact [facility] at [phone] with questions or concerns regarding your invoice.   IF you received labwork today, you will receive an invoice from [facility]. Please contact [facility] at [phone] with questions or concerns regarding your invoice.   Our billing staff will not be able to assist you with questions regarding bills from these companies.  You will be contacted with the lab results as soon as they are available. The fastest way to get your results is to activate your My Chart account. Instructions are located on the last page of this paperwork. If you have not heard from us regarding the results in 2 weeks, please contact this office.    Please continue to take the propranolol and hydroxyzine.  You will continue and increase the prozac to the full tablet.   Please await the contact for the evaluation.

## 2015-04-19 NOTE — Progress Notes (Signed)
[facility] [address], [place] [address] [phone]  Date:  04/19/2015   Name:  Dustin Richard   DOB:  1975-11-13   MRN:  [id number]  PCP:  No PCP Per Patient    History of Present Illness:  Dustin Richard is a 40 y.o. male patient who presents to [facility] for follow up of tremulousness.      Moderately taking his medication.  Reduced 2 beers and 2 shots which seemed to help.  Feels like he is having improvement, but feels like he hit a wall.  He may take 1 or 2 but he says it is usually between 2-4.  He states that the increase that the propranol is more than   He is taking the prozac--which he thinks he may be a little more apathetic--or uncaring to his issues.     Patient Active Problem List   Diagnosis Date Noted  . Morbid obesity (HCC) 04/13/2015    Past Medical History  Diagnosis Date  . Migraine     History reviewed. No pertinent past surgical history.  Social History  Substance Use Topics  . Smoking status: Current Some Day Smoker  . Smokeless tobacco: Never Used  . Alcohol Use: 2.4 - 4.2 oz/week    4-7 Standard drinks or equivalent per week    Family History  Problem Relation Age of Onset  . Diabetes Father   . Hyperlipidemia Father   . Cancer Maternal Grandmother   . Heart disease Maternal Grandfather   . Heart disease Paternal Grandmother   . Hyperlipidemia Paternal Grandmother     No Known Allergies  Medication list has been reviewed and updated.  Current Outpatient Prescriptions on File Prior to Visit  Medication Sig Dispense Refill  . FLUoxetine (PROZAC) 20 MG tablet Take 1 tablet (20 mg total) by mouth daily. 30 tablet 3  . hydrOXYzine (ATARAX/VISTARIL) 25 MG tablet Take 1-4 tablets (25-100 mg total) by mouth 3 (three) times daily as needed. 120 tablet 1  . lisinopril-hydrochlorothiazide (PRINZIDE,ZESTORETIC) 20-12.5 MG tablet Take 1 tablet by mouth daily. 30 tablet 5  . propranolol ER (INDERAL LA) 80 MG 24 hr capsule  Take 1 capsule (80 mg total) by mouth daily. 30 capsule 0  . albuterol (PROVENTIL HFA;VENTOLIN HFA) 108 (90 BASE) MCG/ACT inhaler Inhale 2 puffs into the lungs every 4 (four) hours as needed for wheezing or shortness of breath (cough, shortness of breath or wheezing.). (Patient not taking: Reported on 04/13/2015) 1 Inhaler 1   No current facility-administered medications on file prior to visit.    ROS ROS otherwise unremarkable unless listed above.   Physical Examination: BP 126/70 mmHg  Pulse 70  Temp(Src) 97.6 F (36.4 C) (Oral)  Resp 18  Ht  (1.727 m)  Wt 211 lb (95.709 kg)  BMI 32.09 kg/m2  SpO2 98% Ideal Body Weight: Weight in (lb) to have BMI = 25: 164.1  Physical Exam  Constitutional: He is oriented to person, place, and time. He appears well-developed and well-nourished. No distress.  HENT:  Head: Normocephalic and atraumatic.  Eyes: Conjunctivae and EOM are normal. Pupils are equal, round, and reactive to light.  Cardiovascular: Normal rate.   Pulmonary/Chest: Effort normal. No respiratory distress.  Neurological: He is alert and oriented to person, place, and time.  Skin: Skin is warm and dry. He is not diaphoretic.  Psychiatric: He has a normal mood and affect. His behavior is normal.  continues to have tremulousness when  asked to do activities.   Assessment and Plan: Dustin Richard is a 40 y.o. male who is here today for follow up. We are still awaiting contact for psych evaluation.  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    Tremor - Plan: hydrOXYzine (ATARAX/VISTARIL) 25 MG tablet  Anxiety state - Plan: hydrOXYzine (ATARAX/VISTARIL) 25 MG tablet  Trena PlattStephanie Aysia Lowder, PA-C [facility] 04/19/2015 4:29 PM

## 2015-04-26 ENCOUNTER — Telehealth: Payer: Self-pay

## 2015-04-26 MED FILL — hydrOXYzine HCL 25 MG TABS: 25 | 10 days supply | Qty: 120 | Fill #0

## 2015-04-26 NOTE — Telephone Encounter (Signed)
Dustin Richard needs a Social workerBehavioral health statement completed by [place] English based off this patients last couple of OV's for anxiety I did not feel comfortable feeling this out so I have highlighted all the areas that need to be completed and I will place these forms in your box on 04/26/15 if you could please complete them and return them to the FMLA/Disability box at the 102 checkout desk within 5-7 business days. Thank you!

## 2015-04-27 ENCOUNTER — Ambulatory Visit (INDEPENDENT_AMBULATORY_CARE_PROVIDER_SITE_OTHER): Payer: 59 | Admitting: Physician Assistant

## 2015-04-27 VITALS — BP 120/68 | HR 74 | Temp 98.0°F | Resp 18 | Ht 68.0 in | Wt 207.6 lb

## 2015-04-27 DIAGNOSIS — R112 Nausea with vomiting, unspecified: Secondary | ICD-10-CM

## 2015-04-27 MED ORDER — ONDANSETRON 8 MG PO TBDP: 8.0000 mg | ORAL_TABLET | Freq: Three times a day (TID) | ORAL | Status: AC | PRN

## 2015-04-27 MED FILL — ONDANSETRON ODT 8 MG TABLET: 8 | 10 days supply | Qty: 30 | Fill #0

## 2015-04-27 NOTE — Telephone Encounter (Signed)
Can we do that here or does he need to go somewhere else for that eval?

## 2015-04-27 NOTE — Progress Notes (Signed)
[facility] [address], [place] [address] [phone]  Date:  04/27/2015   Name:  [name] Jauregui   DOB:  05/22/1975   MRN:  [id number]  PCP:  No PCP Per Patient    History of Present Illness:  [name] Claflin is a 40 y.o. male patient who [facility]. Patient continues to have difficulty with tremulousness.  He thinks that it continues to improve however will have more of the tremulousness moreso than other times.  He is taking the propranolol, hydroxyzine, and the fluoxetine.  He continues to cut down etOH intake to 2 beers and 2 shots per day, 2 days per week.  He is also having nausea in the last 3 days.  He has no dizziness or sob.  He does not recall what he ate prior to symptoms, and no one in the family ate this.  He has had some episodes of diarrhea without blood in stool.  No fever.  No hematuria, dysuria, or frequency.   Patient Active Problem List   Diagnosis Date Noted  . Morbid obesity (HCC) 04/13/2015    Past Medical History  Diagnosis Date  . Migraine     No past surgical history on file.  Social History  Substance Use Topics  . Smoking status: Current Some Day Smoker  . Smokeless tobacco: Never Used  . Alcohol Use: 2.4 - 4.2 oz/week    4-7 Standard drinks or equivalent per week    Family History  Problem Relation Age of Onset  . Diabetes Father   . Hyperlipidemia Father   . Cancer Maternal Grandmother   . Heart disease Maternal Grandfather   . Heart disease Paternal Grandmother   . Hyperlipidemia Paternal Grandmother     No Known Allergies  Medication list has been reviewed and updated.  Current Outpatient Prescriptions on File Prior to Visit  Medication Sig Dispense Refill  . albuterol (PROVENTIL HFA;VENTOLIN HFA) 108 (90 BASE) MCG/ACT inhaler Inhale 2 puffs into the lungs every 4 (four) hours as needed for wheezing or shortness of breath (cough, shortness of breath or wheezing.). 1 Inhaler 1  .  FLUoxetine (PROZAC) 20 MG tablet Take 1 tablet (20 mg total) by mouth daily. 30 tablet 3  . hydrOXYzine (ATARAX/VISTARIL) 25 MG tablet Take 1-4 tablets (25-100 mg total) by mouth 3 (three) times daily as needed. 120 tablet 1  . lisinopril-hydrochlorothiazide (PRINZIDE,ZESTORETIC) 20-12.5 MG tablet Take 1 tablet by mouth daily. 30 tablet 5  . propranolol ER (INDERAL LA) 80 MG 24 hr capsule Take 1 capsule (80 mg total) by mouth daily. 30 capsule 0   No current facility-administered medications on file prior to visit.    ROS ROS otherwise unremarkable unless listed above.  Physical Examination: BP 120/68 mmHg  Pulse 74  Temp(Src) 98 F (36.7 C) (Oral)  Resp 18  Ht 5\' 8"  (1.727 m)  Wt 207 lb 9.6 oz (94.167 kg)  BMI 31.57 kg/m2  SpO2 97% Ideal Body Weight: Weight in (lb) to have BMI = 25: 164.1  Physical Exam  Constitutional: He is oriented to person, place, and time. He appears well-developed and well-nourished. No distress.  HENT:  Head: Normocephalic and atraumatic.  Eyes: Conjunctivae and EOM are normal. Pupils are equal, round, and reactive to light.  Cardiovascular: Normal rate.   Pulmonary/Chest: Effort normal. No respiratory distress.  Abdominal: Soft. Normal appearance and bowel sounds are normal. There is no tenderness.  Neurological: He is alert  and oriented to person, place, and time. No cranial nerve deficit. Coordination normal.  Heavily tremulousness with performing detailed skills upon cue.  Skin: Skin is warm and dry. He is not diaphoretic.  Psychiatric: He has a normal mood and affect. His behavior is normal.     Assessment and Plan: NICKEY KLOEPFER is a 40 y.o. male who is here today for cc of nausea, and follow up of tremulousness.  Nausea and vomiting, intractability of vomiting not specified, unspecified vomiting type - Plan: ondansetron (ZOFRAN-ODT) 8 MG disintegrating tablet   Trena Platt, PA-C [facility] 04/27/2015 4:37 PM

## 2015-04-27 NOTE — Telephone Encounter (Signed)
i am submitting the completed forms.  i am not sure what this is for.  To be clear, he needs a psych evaluation for his tremulousness and anxiety.

## 2015-04-27 NOTE — Patient Instructions (Addendum)
IF you received an x-ray today, you will receive an invoice from [facility]. Please contact [facility] at [phone] with questions or concerns regarding your invoice.   IF you received labwork today, you will receive an invoice from [employer]. Please contact [facility] at [phone] with questions or concerns regarding your invoice.   Our billing staff will not be able to assist you with questions regarding bills from these companies.  You will be contacted with the lab results as soon as they are available. The fastest way to get your results is to activate your My Chart account. Instructions are located on the last page of this paperwork. If you have not heard from [place] regarding the results in 2 weeks, please contact this office.    We will continue the prozac at this regimen.  Continue the hydroxyzine.  I will contact referral and see if we can get this appointment scheduled.   This looks like a GI virus.  Keep a clean diet.  Avoid dairy at this time.  Viral Gastroenteritis Viral gastroenteritis is also known as stomach flu. This condition affects the stomach and intestinal tract. It can cause sudden diarrhea and vomiting. The illness typically lasts 3 to 8 days. Most people develop an immune response that eventually gets rid of the virus. While this natural response develops, the virus can make you quite ill. CAUSES  Many different viruses can cause gastroenteritis, such as rotavirus or noroviruses. You can catch one of these viruses by consuming contaminated food or water. You may also catch a virus by sharing utensils or other personal items with an infected person or by touching a contaminated surface. SYMPTOMS  The most common symptoms are diarrhea and vomiting. These problems can cause a severe loss of body fluids (dehydration) and a body salt (electrolyte) imbalance. Other symptoms may  include:  Fever.  Headache.  Fatigue.  Abdominal pain. DIAGNOSIS  Your caregiver can usually diagnose viral gastroenteritis based on your symptoms and a physical exam. A stool sample may also be taken to test for the presence of viruses or other infections. TREATMENT  This illness typically goes away on its own. Treatments are aimed at rehydration. The most serious cases of viral gastroenteritis involve vomiting so severely that you are not able to keep fluids down. In these cases, fluids must be given through an intravenous line (IV). HOME CARE INSTRUCTIONS   Drink enough fluids to keep your urine clear or pale yellow. Drink small amounts of fluids frequently and increase the amounts as tolerated.  Ask your caregiver for specific rehydration instructions.  Avoid:  Foods high in sugar.  Alcohol.  Carbonated drinks.  Tobacco.  Juice.  Caffeine drinks.  Extremely hot or cold fluids.  Fatty, greasy foods.  Too much intake of anything at one time.  Dairy products until 24 to 48 hours after diarrhea stops.  You may consume probiotics. Probiotics are active cultures of beneficial bacteria. They may lessen the amount and number of diarrheal stools in adults. Probiotics can be found in yogurt with active cultures and in supplements.  Wash your hands well to avoid spreading the virus.  Only take over-the-counter or prescription medicines for pain, discomfort, or fever as directed by your caregiver. Do not give aspirin to children. Antidiarrheal medicines are not recommended.  Ask your caregiver if you should continue to take your regular prescribed and over-the-counter medicines.  Keep all follow-up appointments as directed by your caregiver. SEEK IMMEDIATE MEDICAL  CARE IF:   You are unable to keep fluids down.  You do not urinate at least once every 6 to 8 hours.  You develop shortness of breath.  You notice blood in your stool or vomit. This may look like coffee  grounds.  You have abdominal pain that increases or is concentrated in one small area (localized).  You have persistent vomiting or diarrhea.  You have a fever.  The patient is a child younger than 3 months, and he or she has a fever.  The patient is a child older than 3 months, and he or she has a fever and persistent symptoms.  The patient is a child older than 3 months, and he or she has a fever and symptoms suddenly get worse.  The patient is a baby, and he or she has no tears when crying. MAKE SURE YOU:   Understand these instructions.  Will watch your condition.  Will get help right away if you are not doing well or get worse.   This information is not intended to replace advice given to you by your health care provider. Make sure you discuss any questions you have with your health care provider.   Document Released: 12/19/2004 Document Revised: 03/13/2011 Document Reviewed: 10/05/2010 Elsevier Interactive Patient Education [employer]2016 Elsevier Inc.

## 2015-04-29 NOTE — Telephone Encounter (Signed)
This is done at behavioral health.  He needs this evaluation there.  He will need a specialist at this time.  I have placed the referral (please refer), and am waiting for this appointment.

## 2015-05-04 ENCOUNTER — Ambulatory Visit (INDEPENDENT_AMBULATORY_CARE_PROVIDER_SITE_OTHER): Payer: 59 | Admitting: Physician Assistant

## 2015-05-04 VITALS — BP 128/86 | HR 94 | Temp 98.2°F | Resp 18 | Ht 68.0 in | Wt 200.0 lb

## 2015-05-04 DIAGNOSIS — R251 Tremor, unspecified: Secondary | ICD-10-CM | POA: Diagnosis not present

## 2015-05-04 DIAGNOSIS — G250 Essential tremor: Secondary | ICD-10-CM | POA: Diagnosis not present

## 2015-05-04 LAB — VITAMIN B12: VITAMIN B 12: 818 pg/mL (ref 200–1100)

## 2015-05-04 MED ORDER — PROPRANOLOL HCL ER 80 MG PO CP24: 80.0000 mg | ORAL_CAPSULE | Freq: Every day | ORAL | Status: DC

## 2015-05-04 NOTE — Patient Instructions (Addendum)
     IF you received an x-ray today, you will receive an invoice from [facility]. Please contact [facility] at [phone] with questions or concerns regarding your invoice.   IF you received labwork today, you will receive an invoice from [facility]. Please contact [facility] at [phone] with questions or concerns regarding your invoice.   Our billing staff will not be able to assist you with questions regarding bills from these companies.  You will be contacted with the lab results as soon as they are available. The fastest way to get your results is to activate your My Chart account. Instructions are located on the last page of this paperwork. If you have not heard from us regarding the results in 2 weeks, please contact this office.    We will hopefully get some information regarding the psych evaluation.  I would like you to return 05/18/2015 for follow up. Please continue the medicine, and lowering your alcohol intake.  This is doing well for you.

## 2015-05-04 NOTE — Progress Notes (Signed)
[facility] [address], [place] [address] [phone]  Date:  05/04/2015   Name:  Dustin Richard   DOB:  12/11/1975   MRN:  [id number]  PCP:  No PCP Per Patient    History of Present Illness:  Dustin Richard is a 40 y.o. male patient who [facility] follow up of tremulousness.  Continues to have tremulousness at this time, though he states that this has improved.  He went the entire weekend without the sensation.  He has not had etOH in 3 days, which he states is an improvement from his prior etOH use.  He has no dizziness or sob.  He has no chest pains.  He will have bouts of tremulousness.  He has no culprit or precursor to his symptoms.  Triggers are unknown.  Not associated with diet at this time.      Patient Active Problem List   Diagnosis Date Noted  . Morbid obesity (HCC) 04/13/2015    Past Medical History  Diagnosis Date  . Migraine     History reviewed. No pertinent past surgical history.  Social History  Substance Use Topics  . Smoking status: Current Some Day Smoker  . Smokeless tobacco: Never Used  . Alcohol Use: 2.4 - 4.2 oz/week    4-7 Standard drinks or equivalent per week    Family History  Problem Relation Age of Onset  . Diabetes Father   . Hyperlipidemia Father   . Cancer Maternal Grandmother   . Heart disease Maternal Grandfather   . Heart disease Paternal Grandmother   . Hyperlipidemia Paternal Grandmother     No Known Allergies  Medication list has been reviewed and updated.  Current Outpatient Prescriptions on File Prior to Visit  Medication Sig Dispense Refill  . albuterol (PROVENTIL HFA;VENTOLIN HFA) 108 (90 BASE) MCG/ACT inhaler Inhale 2 puffs into the lungs every 4 (four) hours as needed for wheezing or shortness of breath (cough, shortness of breath or wheezing.). 1 Inhaler 1  . FLUoxetine (PROZAC) 20 MG tablet Take 1 tablet (20 mg total) by mouth daily. 30 tablet 3  . hydrOXYzine (ATARAX/VISTARIL) 25  MG tablet Take 1-4 tablets (25-100 mg total) by mouth 3 (three) times daily as needed. 120 tablet 1  . lisinopril-hydrochlorothiazide (PRINZIDE,ZESTORETIC) 20-12.5 MG tablet Take 1 tablet by mouth daily. 30 tablet 5  . ondansetron (ZOFRAN-ODT) 8 MG disintegrating tablet Take 1 tablet (8 mg total) by mouth every 8 (eight) hours as needed for nausea. 30 tablet 0  . propranolol ER (INDERAL LA) 80 MG 24 hr capsule Take 1 capsule (80 mg total) by mouth daily. 30 capsule 0   No current facility-administered medications on file prior to visit.    ROS ROS otherwise unremarkable unless listed above.   Physical Examination: BP 128/86 mmHg  Pulse 94  Temp(Src) 98.2 F (36.8 C) (Oral)  Resp 18  Ht 5\' 8"  (1.727 m)  Wt 200 lb (90.719 kg)  BMI 30.42 kg/m2  SpO2 96% Ideal Body Weight: Weight in (lb) to have BMI = 25: 164.1  Physical Exam  Constitutional: He is oriented to person, place, and time. He appears well-developed and well-nourished. No distress.  HENT:  Head: Normocephalic and atraumatic.  Eyes: Conjunctivae and EOM are normal. Pupils are equal, round, and reactive to light.  Cardiovascular: Normal rate and regular rhythm.  Exam reveals no friction rub.   No murmur heard. Pulmonary/Chest: Effort normal and breath sounds normal. No respiratory  distress. He has no wheezes.  Neurological: He is alert and oriented to person, place, and time. He has normal strength.  Continues to have tremulousness with activity.  This is uncontrolled.     Skin: Skin is warm and dry. He is not diaphoretic.  Psychiatric: He has a normal mood and affect. His behavior is normal.     Assessment and Plan: Dustin Richard is a 40 y.o. male who is here today for follow up. Discussed with referrals that I am attempting to get appointment for psych consult.  Contacted [employer] and received voice message.  Referrals team left message for follow up.  At this time, he is still not able to perform his duties of work.    Refilling betablocker as this is working.  Will assess b12 though I think this is unlikely the culprit.  Possible performance anxiety likely.   Tremulousness - Plan: Ambulatory referral to Neurology  Essential tremor - Plan: propranolol ER (INDERAL LA) 80 MG 24 hr capsule, Vitamin B12   Trena Platt, PA-C [facility] 05/04/2015 4:16 PM

## 2015-05-06 MED FILL — PROPRANOLOL ER 80 MG CAP: 80 | 30 days supply | Qty: 30 | Fill #0

## 2015-05-13 MED FILL — FLUoxetine HCL 20 MG TABS: 20 | 30 days supply | Qty: 30 | Fill #1

## 2015-05-18 ENCOUNTER — Ambulatory Visit (INDEPENDENT_AMBULATORY_CARE_PROVIDER_SITE_OTHER): Payer: 59 | Admitting: Physician Assistant

## 2015-05-18 VITALS — BP 118/82 | HR 66 | Temp 97.9°F | Resp 16 | Ht 68.0 in | Wt 202.6 lb

## 2015-05-18 DIAGNOSIS — R251 Tremor, unspecified: Secondary | ICD-10-CM

## 2015-05-18 LAB — GLUCOSE, POCT (MANUAL RESULT ENTRY): POC GLUCOSE: 111 mg/dL — AB (ref 70–99)

## 2015-05-18 NOTE — Patient Instructions (Signed)
Continue to take your medication as prescribed.   Please await attentively to contact for this appointment with the psychiatrist.  This takes precedence over any other appointment. Also, await contact with the neurologist.

## 2015-05-18 NOTE — Progress Notes (Signed)
[facility] [address], [place] [address] [phone]  Date:  05/18/2015   Name:  Dustin CalenderMatthew W Richard   DOB:  10/29/1975   MRN:  [id number]  PCP:  No PCP Per Patient   Chief Complaint  Patient presents with  . Follow-up    Tremulousness   History of Present Illness:  Dustin CalenderMatthew W Richard is a 40 y.o. male patient who [facility] of tremulousness. Follow up since initial visit more than 1 month ago.   Patient states that his tremulousness gets better controlled though still present.   He can go days without tremors, though they will present within days intermittently.    Patient Active Problem List   Diagnosis Date Noted  . Morbid obesity (HCC) 04/13/2015    Past Medical History  Diagnosis Date  . Migraine     History reviewed. No pertinent past surgical history.  Social History  Substance Use Topics  . Smoking status: Current Some Day Smoker  . Smokeless tobacco: Never Used  . Alcohol Use: 2.4 - 4.2 oz/week    4-7 Standard drinks or equivalent per week    Family History  Problem Relation Age of Onset  . Diabetes Father   . Hyperlipidemia Father   . Cancer Maternal Grandmother   . Heart disease Maternal Grandfather   . Heart disease Paternal Grandmother   . Hyperlipidemia Paternal Grandmother     No Known Allergies  Medication list has been reviewed and updated.  Current Outpatient Prescriptions on File Prior to Visit  Medication Sig Dispense Refill  . albuterol (PROVENTIL HFA;VENTOLIN HFA) 108 (90 BASE) MCG/ACT inhaler Inhale 2 puffs into the lungs every 4 (four) hours as needed for wheezing or shortness of breath (cough, shortness of breath or wheezing.). 1 Inhaler 1  . FLUoxetine (PROZAC) 20 MG tablet Take 1 tablet (20 mg total) by mouth daily. 30 tablet 3  . hydrOXYzine (ATARAX/VISTARIL) 25 MG tablet Take 1-4 tablets (25-100 mg total) by mouth 3 (three) times daily as needed. 120 tablet 1  . lisinopril-hydrochlorothiazide  (PRINZIDE,ZESTORETIC) 20-12.5 MG tablet Take 1 tablet by mouth daily. 30 tablet 5  . ondansetron (ZOFRAN-ODT) 8 MG disintegrating tablet Take 1 tablet (8 mg total) by mouth every 8 (eight) hours as needed for nausea. 30 tablet 0  . propranolol ER (INDERAL LA) 80 MG 24 hr capsule Take 1 capsule (80 mg total) by mouth daily. 30 capsule 0   No current facility-administered medications on file prior to visit.    ROS ROS otherwise unremarkable unless listed above.   Physical Examination: BP 118/82 mmHg  Pulse 66  Temp(Src) 97.9 F (36.6 C) (Oral)  Resp 16  Ht 5\' 8"  (1.727 m)  Wt 202 lb 9.6 oz (91.899 kg)  BMI 30.81 kg/m2  SpO2 97% Ideal Body Weight: Weight in (lb) to have BMI = 25: 164.1  Physical Exam  Constitutional: He is oriented to person, place, and time. He appears well-developed and well-nourished. No distress.  HENT:  Head: Normocephalic and atraumatic.  Eyes: Conjunctivae and EOM are normal. Pupils are equal, round, and reactive to light.  Cardiovascular: Normal rate.   Pulmonary/Chest: Effort normal. No respiratory distress.  Neurological: He is alert and oriented to person, place, and time.  Tremulousness with fine manipulation.  Skin: Skin is warm and dry. He is not diaphoretic.  Psychiatric: He has a normal mood and affect. His behavior is normal.   Results for orders placed or performed in  visit on 05/18/15  POCT glucose (manual entry)  Result Value Ref Range   POC Glucose 111 (A) 70 - 99 mg/dl   Assessment and Plan: Dustin Richard is a 40 y.o. male who is here today for recheck. We will have him rtc in 1 week.  Will attempt to get sooner appt than 1 month from now.  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   We will await neuro and psych consult at this time.  Glucose normal--non fasting at this time.   Tremulousness - Plan: POCT glucose (manual entry)  Trena Platt, PA-C [facility] 05/18/2015 5:31 PM

## 2015-05-24 ENCOUNTER — Ambulatory Visit (INDEPENDENT_AMBULATORY_CARE_PROVIDER_SITE_OTHER): Payer: 59 | Admitting: Physician Assistant

## 2015-05-24 VITALS — BP 129/87 | HR 67 | Temp 98.4°F | Resp 16 | Ht 68.0 in | Wt 202.0 lb

## 2015-05-24 DIAGNOSIS — R251 Tremor, unspecified: Secondary | ICD-10-CM | POA: Diagnosis not present

## 2015-05-24 NOTE — Patient Instructions (Signed)
     IF you received an x-ray today, you will receive an invoice from [facility]. Please contact [facility] at [phone] with questions or concerns regarding your invoice.   IF you received labwork today, you will receive an invoice from [facility]. Please contact [facility] at [phone] with questions or concerns regarding your invoice.   Our billing staff will not be able to assist you with questions regarding bills from these companies.  You will be contacted with the lab results as soon as they are available. The fastest way to get your results is to activate your My Chart account. Instructions are located on the last page of this paperwork. If you have not heard from us regarding the results in 2 weeks, please contact this office.      

## 2015-05-28 MED FILL — hydrOXYzine HCL 25 MG TABS: 25 | 10 days supply | Qty: 120 | Fill #1

## 2015-05-28 NOTE — Progress Notes (Signed)
Dustin Richard [address], [place] [address] [phone]  Date:  05/24/2015   Name:  Dustin Richard   DOB:  [birth year]   MRN:  [id number]  PCP:  No PCP Per Patient    History of Present Illness:  Dustin Richard is a 40 y.o. male patient who presents to [facility] for follow up.  He has psych consult scheduled for June.  He continues to have tremulousness.  He is taking the fluoxetine, and having vistaril at this time.  Triggers are unknown to him.  He will have tremulousness that will last for minutes.  etOH less with 2 shots 2 days per week.     Patient Active Problem List   Diagnosis Date Noted  . Morbid obesity (HCC) 04/13/2015    Past Medical History  Diagnosis Date  . Migraine     History reviewed. No pertinent past surgical history.  Social History  Substance Use Topics  . Smoking status: Current Some Day Smoker  . Smokeless tobacco: Never Used  . Alcohol Use: 2.4 - 4.2 oz/week    4-7 Standard drinks or equivalent per week    Family History  Problem Relation Age of Onset  . Diabetes Father   . Hyperlipidemia Father   . Cancer Maternal Grandmother   . Heart disease Maternal Grandfather   . Heart disease Paternal Grandmother   . Hyperlipidemia Paternal Grandmother     No Known Allergies  Medication list has been reviewed and updated.  Current Outpatient Prescriptions on File Prior to Visit  Medication Sig Dispense Refill  . albuterol (PROVENTIL HFA;VENTOLIN HFA) 108 (90 BASE) MCG/ACT inhaler Inhale 2 puffs into the lungs every 4 (four) hours as needed for wheezing or shortness of breath (cough, shortness of breath or wheezing.). 1 Inhaler 1  . FLUoxetine (PROZAC) 20 MG tablet Take 1 tablet (20 mg total) by mouth daily. 30 tablet 3  . hydrOXYzine (ATARAX/VISTARIL) 25 MG tablet Take 1-4 tablets (25-100 mg total) by mouth 3 (three) times daily as needed. 120 tablet 1  . lisinopril-hydrochlorothiazide (PRINZIDE,ZESTORETIC) 20-12.5 MG tablet Take  1 tablet by mouth daily. 30 tablet 5  . ondansetron (ZOFRAN-ODT) 8 MG disintegrating tablet Take 1 tablet (8 mg total) by mouth every 8 (eight) hours as needed for nausea. 30 tablet 0  . propranolol ER (INDERAL LA) 80 MG 24 hr capsule Take 1 capsule (80 mg total) by mouth daily. 30 capsule 0   No current facility-administered medications on file prior to visit.    ROS ROS otherwise unremarkable unless listed above.    Physical Examination: BP 129/87 mmHg  Pulse 67  Temp(Src) 98.4 F (36.9 C) (Oral)  Resp 16  Ht  (1.727 m)  Wt 202 lb (91.627 kg)  BMI 30.72 kg/m2  SpO2 97% Ideal Body Weight: Weight in (lb) to have BMI = 25: 164.1  Physical Exam  Constitutional: He is oriented to person, place, and time. He appears well-developed and well-nourished. No distress.  HENT:  Head: Normocephalic and atraumatic.  Eyes: Conjunctivae and EOM are normal. Pupils are equal, round, and reactive to light.  Cardiovascular: Normal rate.   Pulmonary/Chest: Effort normal. No respiratory distress.  Neurological: He is alert and oriented to person, place, and time. No cranial nerve deficit.  Tremulousness with fine manipulation.  Skin: Skin is warm and dry. He is not diaphoretic.  Psychiatric: He has a normal mood and affect. His behavior is normal.     Assessment and  Plan: Dustin CalenderMatthew W Richard is a 40 y.o. male who is here today for follow up of tremulousness. Awaiting psych consult and neuro consult.  At this time he continues to be unable to perform his activities.  Tremulousness  Dustin PlattStephanie Adrianne Shackleton, PA-C [facility] 05/28/2015 8:20 PM

## 2015-05-29 ENCOUNTER — Ambulatory Visit (INDEPENDENT_AMBULATORY_CARE_PROVIDER_SITE_OTHER): Payer: 59 | Admitting: Physician Assistant

## 2015-05-29 VITALS — BP 122/70 | HR 70 | Temp 98.1°F | Resp 18 | Ht 69.0 in | Wt 202.0 lb

## 2015-05-29 DIAGNOSIS — R251 Tremor, unspecified: Secondary | ICD-10-CM | POA: Diagnosis not present

## 2015-05-29 DIAGNOSIS — F411 Generalized anxiety disorder: Secondary | ICD-10-CM | POA: Diagnosis not present

## 2015-05-29 MED ORDER — HYDROXYZINE HCL 25 MG PO TABS: 25.0000 mg | ORAL_TABLET | Freq: Three times a day (TID) | ORAL | Status: DC | PRN

## 2015-05-29 NOTE — Patient Instructions (Addendum)
.  I would like you to return in 7 days, unless I talk to you sooner on the phone with a sooner appointment.       IF you received an x-ray today, you will receive an invoice from [facility]. Please contact [facility] at [phone] with questions or concerns regarding your invoice.   IF you received labwork today, you will receive an invoice from [facility]. Please contact [facility] at [phone] with questions or concerns regarding your invoice.   Our billing staff will not be able to assist you with questions regarding bills from these companies.  You will be contacted with the lab results as soon as they are available. The fastest way to get your results is to activate your My Chart account. Instructions are located on the last page of this paperwork. If you have not heard from us regarding the results in 2 weeks, please contact this office.

## 2015-05-29 NOTE — Progress Notes (Signed)
[facility] [address] [place] [address] [phone]  Date:  05/29/2015   Name:  Dustin Richard   DOB:  1975-05-19   MRN:  [id number]  PCP:  No PCP Per Patient   Chief Complaint  Patient presents with  . Follow-up    History of Present Illness:  Dustin Richard is a 40 y.o. male patient who presents to [facility] for follow up of tremulousness. Patient states that his tremulousness appears to be improving.  He had less tremulousness this week.  He is compliant with the fluoxetine  daily, and vistaril.  He has not had barely any of the hydroxyzine this week.  If he does have an episode, he is taking 4 tablets for episodes of tremulousness.   Psych consult 07/29/2015     Patient Active Problem List   Diagnosis Date Noted  . Morbid obesity (HCC) 04/13/2015    Past Medical History  Diagnosis Date  . Migraine     No past surgical history on file.  Social History  Substance Use Topics  . Smoking status: Current Some Day Smoker  . Smokeless tobacco: Never Used  . Alcohol Use: 2.4 - 4.2 oz/week    4-7 Standard drinks or equivalent per week    Family History  Problem Relation Age of Onset  . Diabetes Father   . Hyperlipidemia Father   . Cancer Maternal Grandmother   . Heart disease Maternal Grandfather   . Heart disease Paternal Grandmother   . Hyperlipidemia Paternal Grandmother     No Known Allergies  Medication list has been reviewed and updated.  Current Outpatient Prescriptions on File Prior to Visit  Medication Sig Dispense Refill  . albuterol (PROVENTIL HFA;VENTOLIN HFA) 108 (90 BASE) MCG/ACT inhaler Inhale 2 puffs into the lungs every 4 (four) hours as needed for wheezing or shortness of breath (cough, shortness of breath or wheezing.). 1 Inhaler 1  . FLUoxetine (PROZAC) 20 MG tablet Take 1 tablet (20 mg total) by mouth daily. 30 tablet 3  . hydrOXYzine (ATARAX/VISTARIL) 25 MG tablet Take 1-4 tablets (25-100 mg total) by mouth 3 (three)  times daily as needed. 120 tablet 1  . lisinopril-hydrochlorothiazide (PRINZIDE,ZESTORETIC) 20-12.5 MG tablet Take 1 tablet by mouth daily. 30 tablet 5  . ondansetron (ZOFRAN-ODT) 8 MG disintegrating tablet Take 1 tablet (8 mg total) by mouth every 8 (eight) hours as needed for nausea. 30 tablet 0  . propranolol ER (INDERAL LA) 80 MG 24 hr capsule Take 1 capsule (80 mg total) by mouth daily. 30 capsule 0   No current facility-administered medications on file prior to visit.    ROS ROS otherwise unremarkable unless listed above.   Physical Examination: There were no vitals taken for this visit. Ideal Body Weight:    Physical Exam  Constitutional: He is oriented to person, place, and time. He appears well-developed and well-nourished. No distress.  HENT:  Head: Normocephalic and atraumatic.  Eyes: Conjunctivae and EOM are normal. Pupils are equal, round, and reactive to light.  Cardiovascular: Normal rate.   Pulmonary/Chest: Effort normal. No respiratory distress.  Neurological: He is alert and oriented to person, place, and time.  Tremulousness more prominent with fine movement.  Skin: Skin is warm and dry. He is not diaphoretic.  Psychiatric: He has a normal mood and affect. His behavior is normal.     Assessment and Plan: Dustin Richard is a 40 y.o. male who is here today for follow up. I  have advised him to rtc in 1 week.  If i can get a sooner appt with psych, then we will do at that time.    Tremulousness  Tremor - Plan: hydrOXYzine (ATARAX/VISTARIL) 25 MG tablet  Anxiety state - Plan: hydrOXYzine (ATARAX/VISTARIL) 25 MG tablet  Trena PlattStephanie English, PA-C [facility] 05/29/2015 9:18 AM

## 2015-06-04 ENCOUNTER — Ambulatory Visit (INDEPENDENT_AMBULATORY_CARE_PROVIDER_SITE_OTHER): Payer: 59 | Admitting: Physician Assistant

## 2015-06-04 VITALS — BP 118/80 | HR 75 | Temp 97.5°F | Resp 16 | Ht 69.0 in | Wt 199.0 lb

## 2015-06-04 DIAGNOSIS — F411 Generalized anxiety disorder: Secondary | ICD-10-CM

## 2015-06-04 DIAGNOSIS — R251 Tremor, unspecified: Secondary | ICD-10-CM | POA: Diagnosis not present

## 2015-06-04 NOTE — Patient Instructions (Addendum)
     IF you received an x-ray today, you will receive an invoice from [facility]. Please contact [facility] at [phone] with questions or concerns regarding your invoice.   IF you received labwork today, you will receive an invoice from [facility]. Please contact [facility] at [phone] with questions or concerns regarding your invoice.   Our billing staff will not be able to assist you with questions regarding bills from these companies.  You will be contacted with the lab results as soon as they are available. The fastest way to get your results is to activate your My Chart account. Instructions are located on the last page of this paperwork. If you have not heard from us regarding the results in 2 weeks, please contact this office.    Let's meet up in 10 days.  I may contact you sooner for more information. Remember to start 1.5 tablets of the fluoxetine.

## 2015-06-11 ENCOUNTER — Other Ambulatory Visit: Payer: Self-pay | Admitting: Physician Assistant

## 2015-06-11 MED FILL — hydrOXYzine HCL 25 MG TABS: 25 | 10 days supply | Qty: 120 | Fill #0

## 2015-06-11 MED FILL — FLUoxetine HCL 20 MG TABS: 20 | 30 days supply | Qty: 30 | Fill #2

## 2015-06-14 ENCOUNTER — Ambulatory Visit (INDEPENDENT_AMBULATORY_CARE_PROVIDER_SITE_OTHER): Payer: 59 | Admitting: Physician Assistant

## 2015-06-14 VITALS — BP 132/84 | HR 74 | Temp 97.9°F | Resp 16 | Ht 69.0 in | Wt 200.0 lb

## 2015-06-14 DIAGNOSIS — F411 Generalized anxiety disorder: Secondary | ICD-10-CM | POA: Diagnosis not present

## 2015-06-14 DIAGNOSIS — R251 Tremor, unspecified: Secondary | ICD-10-CM | POA: Diagnosis not present

## 2015-06-14 MED ORDER — HYDROXYZINE HCL 25 MG PO TABS: 25.0000 mg | ORAL_TABLET | Freq: Three times a day (TID) | ORAL | Status: DC | PRN

## 2015-06-14 MED ORDER — FLUOXETINE HCL 20 MG PO TABS: 30.0000 mg | ORAL_TABLET | Freq: Every day | ORAL | Status: DC

## 2015-06-14 MED FILL — PROPRANOLOL ER 80 MG CAP: 80 | 90 days supply | Qty: 90 | Fill #0

## 2015-06-14 NOTE — Patient Instructions (Signed)
     IF you received an x-ray today, you will receive an invoice from [facility]. Please contact [facility] at [phone] with questions or concerns regarding your invoice.   IF you received labwork today, you will receive an invoice from [facility]. Please contact [facility] at [phone] with questions or concerns regarding your invoice.   Our billing staff will not be able to assist you with questions regarding bills from these companies.  You will be contacted with the lab results as soon as they are available. The fastest way to get your results is to activate your My Chart account. Instructions are located on the last page of this paperwork. If you have not heard from us regarding the results in 2 weeks, please contact this office.      

## 2015-06-15 MED FILL — LISINOPRIL-HCTZ 20-12.5 MG: 20-12.5 | 60 days supply | Qty: 60 | Fill #2

## 2015-06-19 NOTE — Progress Notes (Signed)
[facility] [address] [place] [address] [phone]  Date:  06/14/2015   Name:  Dustin Richard   DOB:  12/05/1975   MRN:  [id number]  PCP:  No PCP Per Patient    History of Present Illness:  Dustin Richard is a 40 y.o. male patient who presents to [facility] for cc of tremulousness. Patient continues to have tremulousness though this has improved greatly.  Yesterday he had one episode of the tremors that lasted for about 4 hours.  No associated dizziness, diaphoresis, nausea, abdominal pain, or palpitations, or vision changes.  She had to use 4 tablets of the 25 vistaril 6 hours apart.  He has been compliant with  of the fluoxetine.     Patient Active Problem List   Diagnosis Date Noted  . Morbid obesity (HCC) 04/13/2015    Past Medical History  Diagnosis Date  . Migraine     History reviewed. No pertinent past surgical history.  Social History  Substance Use Topics  . Smoking status: Current Some Day Smoker  . Smokeless tobacco: Never Used  . Alcohol Use: 2.4 - 4.2 oz/week    4-7 Standard drinks or equivalent per week    Family History  Problem Relation Age of Onset  . Diabetes Father   . Hyperlipidemia Father   . Cancer Maternal Grandmother   . Heart disease Maternal Grandfather   . Heart disease Paternal Grandmother   . Hyperlipidemia Paternal Grandmother     No Known Allergies  Medication list has been reviewed and updated.  Current Outpatient Prescriptions on File Prior to Visit  Medication Sig Dispense Refill  . albuterol (PROVENTIL HFA;VENTOLIN HFA) 108 (90 BASE) MCG/ACT inhaler Inhale 2 puffs into the lungs every 4 (four) hours as needed for wheezing or shortness of breath (cough, shortness of breath or wheezing.). 1 Inhaler 1  . lisinopril-hydrochlorothiazide (PRINZIDE,ZESTORETIC) 20-12.5 MG tablet TAKE 1 TABLET BY MOUTH DAILY. 90 tablet 0  . ondansetron (ZOFRAN-ODT) 8 MG disintegrating tablet Take 1 tablet (8 mg total) by mouth  every 8 (eight) hours as needed for nausea. 30 tablet 0  . propranolol ER (INDERAL LA) 80 MG 24 hr capsule TAKE 1 CAPSULE BY MOUTH ONCE DAILY 90 capsule 0   No current facility-administered medications on file prior to visit.    ROS ROS otherwise unremarkable unless listed above.   Physical Examination: BP 132/84 mmHg  Pulse 74  Temp(Src) 97.9 F (36.6 C) (Oral)  Resp 16  Ht  (1.753 m)  Wt 200 lb (90.719 kg)  BMI 29.52 kg/m2  SpO2 99% Ideal Body Weight: Weight in (lb) to have BMI = 25: 168.9  Physical Exam  Constitutional: He is oriented to person, place, and time. He appears well-developed and well-nourished. No distress.  HENT:  Head: Normocephalic and atraumatic.  Eyes: Conjunctivae and EOM are normal. Pupils are equal, round, and reactive to light.  Cardiovascular: Normal rate and regular rhythm.  Exam reveals no gallop and no friction rub.   No murmur heard. Pulmonary/Chest: Effort normal. No apnea. No respiratory distress. He has no decreased breath sounds. He has no wheezes. He has no rhonchi.  Neurological: He is alert and oriented to person, place, and time.  tremulounsess with fine movement.  Skin: Skin is warm and dry. He is not diaphoretic.  Psychiatric: He has a normal mood and affect. His behavior is normal.     Assessment and Plan: Dustin Richard is a 40 y.o.  male who is here today for tremulousness. 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  Continue to await psych consult.  1. Anxiety state - Care order/instruction - hydrOXYzine (ATARAX/VISTARIL) 25 MG tablet; Take 1-4 tablets (25-100 mg total) by mouth 3 (three) times daily as needed.  Dispense: 120 tablet; Refill: 1 - FLUoxetine (PROZAC) 20 MG tablet; Take 1.5 tablets (30 mg total) by mouth daily.  Dispense: 60 tablet; Refill: 3  2. Tremor - hydrOXYzine (ATARAX/VISTARIL) 25 MG tablet; Take 1-4 tablets (25-100 mg total) by mouth 3 (three) times daily as  needed.  Dispense: 120 tablet; Refill: 1   Trena PlattStephanie English, PA-C [facility] 06/19/2015 5:51 PM

## 2015-06-24 ENCOUNTER — Ambulatory Visit (INDEPENDENT_AMBULATORY_CARE_PROVIDER_SITE_OTHER): Payer: 59 | Admitting: Physician Assistant

## 2015-06-24 VITALS — BP 124/80 | HR 70 | Temp 98.0°F | Resp 18 | Ht 69.0 in | Wt 201.0 lb

## 2015-06-24 DIAGNOSIS — F411 Generalized anxiety disorder: Secondary | ICD-10-CM | POA: Diagnosis not present

## 2015-06-24 DIAGNOSIS — R251 Tremor, unspecified: Secondary | ICD-10-CM

## 2015-06-24 NOTE — Patient Instructions (Addendum)
     IF you received an x-ray today, you will receive an invoice from [facility]. Please contact [facility] at [phone] with questions or concerns regarding your invoice.   IF you received labwork today, you will receive an invoice from [facility]. Please contact [facility] at [phone] with questions or concerns regarding your invoice.   Our billing staff will not be able to assist you with questions regarding bills from these companies.  You will be contacted with the lab results as soon as they are available. The fastest way to get your results is to activate your My Chart account. Instructions are located on the last page of this paperwork. If you have not heard from us regarding the results in 2 weeks, please contact this office.    Please increase the dose to 40mg  of the fluoxetine.

## 2015-06-28 ENCOUNTER — Telehealth: Payer: Self-pay

## 2015-06-28 NOTE — Telephone Encounter (Signed)
[employer] needs more information regarding the patients condition, I have completed what I could from the chart notes and highlighted the areas I was unclear about please complete the highlighted areas and return to the FMLA/Disability box at the 102 checkout desk within 5-7 business days. Thank you I will place them in your box on 06/28/15.

## 2015-07-01 NOTE — Progress Notes (Signed)
[facility] [address], [place] [address] [phone]  Date:  06/04/2015   Name:  Dustin CalenderMatthew W Richard   DOB:  10/28/1975   MRN:  [id number]  PCP:  No PCP Per Patient   Chief Complaint  Patient presents with  . Follow-up    anxiety      History of Present Illness:  Dustin Richard is a 40 y.o. male patient who [facility] follow up of tremulousness.  Patient continues to have tremulousness episodes.   Continues to take the propanol and fluoxetine.  Vistaril was done as needed.     Patient Active Problem List   Diagnosis Date Noted  . Morbid obesity (HCC) 04/13/2015    Past Medical History  Diagnosis Date  . Migraine     No past surgical history on file.  Social History  Substance Use Topics  . Smoking status: Current Some Day Smoker  . Smokeless tobacco: Never Used  . Alcohol Use: 2.4 - 4.2 oz/week    4-7 Standard drinks or equivalent per week    Family History  Problem Relation Age of Onset  . Diabetes Father   . Hyperlipidemia Father   . Cancer Maternal Grandmother   . Heart disease Maternal Grandfather   . Heart disease Paternal Grandmother   . Hyperlipidemia Paternal Grandmother     No Known Allergies  Medication list has been reviewed and updated.  Current Outpatient Prescriptions on File Prior to Visit  Medication Sig Dispense Refill  . albuterol (PROVENTIL HFA;VENTOLIN HFA) 108 (90 BASE) MCG/ACT inhaler Inhale 2 puffs into the lungs every 4 (four) hours as needed for wheezing or shortness of breath (cough, shortness of breath or wheezing.). 1 Inhaler 1  . ondansetron (ZOFRAN-ODT) 8 MG disintegrating tablet Take 1 tablet (8 mg total) by mouth every 8 (eight) hours as needed for nausea. (Patient not taking: Reported on 06/24/2015) 30 tablet 0   No current facility-administered medications on file prior to visit.    ROS ROS otherwise unremarkable unless listed above.   Physical Examination: BP 118/80 mmHg  Pulse 75   Temp(Src) 97.5 F (36.4 C) (Oral)  Resp 16  Ht 5\' 9"  (1.753 m)  Wt 199 lb (90.266 kg)  BMI 29.37 kg/m2  SpO2 98% Ideal Body Weight: Weight in (lb) to have BMI = 25: 168.9  Physical Exam  Constitutional: He is oriented to person, place, and time. He appears well-developed and well-nourished. No distress.  HENT:  Head: Normocephalic and atraumatic.  Eyes: Conjunctivae and EOM are normal. Pupils are equal, round, and reactive to light.  Cardiovascular: Normal rate.   Pulmonary/Chest: Effort normal. No respiratory distress.  Neurological: He is alert and oriented to person, place, and time.  Tremulousness with fine manipulation and grasping.    Skin: Skin is warm and dry. He is not diaphoretic.  Psychiatric: He has a normal mood and affect. His behavior is normal.     Assessment and Plan: Dustin CalenderMatthew W Richard is a 40 y.o. male who is here today for follow up of tremulousness. Increase the fluoxetine to 1.5 tablets. 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  We will await consult from psych at this time. Anxiety state  Tremulousness  Trena PlattStephanie English, PA-C [facility] 6/29/20171:37 PM

## 2015-07-01 NOTE — Progress Notes (Signed)
[facility] [address], [place] [address] [phone]  Date:  06/24/2015   Name:  Dustin Richard Sear[place]   DOB:  07/02/1975   MRN:  [id number]  PCP:  No PCP Per Patient    History of Present Illness:  Dustin Richard Prochnow is a 40 y.o. male patient who [facility] chief complaint of anxiety and tremulousness. Patient states that it has improved some. He is currently taking the fluoxetine 30 mg at this time. He has episode of tremulousness that lasted for about 4 hours. Patient had used 100 mg of the Vistaril, followed with another 2 and the tremulousness had dissipated. 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 We are anxiously awaiting the psychiatric consult.   Patient Active Problem List   Diagnosis Date Noted  . Morbid obesity (HCC) 04/13/2015    Past Medical History  Diagnosis Date  . Migraine     History reviewed. No pertinent past surgical history.  Social History  Substance Use Topics  . Smoking status: Current Some Day Smoker  . Smokeless tobacco: Never Used  . Alcohol Use: 2.4 - 4.2 oz/week    4-7 Standard drinks or equivalent per week    Family History  Problem Relation Age of Onset  . Diabetes Father   . Hyperlipidemia Father   . Cancer Maternal Grandmother   . Heart disease Maternal Grandfather   . Heart disease Paternal Grandmother   . Hyperlipidemia Paternal Grandmother     No Known Allergies  Medication list has been reviewed and updated.  Current Outpatient Prescriptions on File Prior to Visit  Medication Sig Dispense Refill  . albuterol (PROVENTIL HFA;VENTOLIN HFA) 108 (90 BASE) MCG/ACT inhaler Inhale 2 puffs into the lungs every 4 (four) hours as needed for wheezing or shortness of breath (cough, shortness of breath or wheezing.). 1 Inhaler 1  . FLUoxetine (PROZAC) 20 MG tablet Take 1.5 tablets (30 mg total) by mouth daily. 60 tablet 3  . hydrOXYzine (ATARAX/VISTARIL) 25 MG tablet  Take 1-4 tablets (25-100 mg total) by mouth 3 (three) times daily as needed. 120 tablet 1  . lisinopril-hydrochlorothiazide (PRINZIDE,ZESTORETIC) 20-12.5 MG tablet TAKE 1 TABLET BY MOUTH DAILY. 90 tablet 0  . propranolol ER (INDERAL LA) 80 MG 24 hr capsule TAKE 1 CAPSULE BY MOUTH ONCE DAILY 90 capsule 0  . ondansetron (ZOFRAN-ODT) 8 MG disintegrating tablet Take 1 tablet (8 mg total) by mouth every 8 (eight) hours as needed for nausea. (Patient not taking: Reported on 06/24/2015) 30 tablet 0   No current facility-administered medications on file prior to visit.    ROS Ros otherwise unremarkable unless listed above.  Physical Examination: BP 124/80 mmHg  Pulse 70  Temp(Src) 98 F (36.7 C) (Oral)  Resp 18  Ht 5\' 9"  (1.753 m)  Wt 201 lb (91.173 kg)  BMI 29.67 kg/m2  SpO2 97% Ideal Body Weight: Weight in (lb) to have BMI = 25: 168.9  Physical Exam  Constitutional: He is oriented to person, place, and time. He appears well-developed and well-nourished. No distress.  HENT:  Head: Normocephalic and atraumatic.  Eyes: Conjunctivae and EOM are normal. Pupils are equal, round, and reactive to light.  Cardiovascular: Normal rate.   Pulmonary/Chest: Effort normal. No respiratory distress.  Neurological: He is alert and oriented to person, place, and time.  Tremulousness continues with fine manipulation and grasping of materials.  Skin: Skin is warm and  dry. He is not diaphoretic.  Psychiatric: He has a normal mood and affect. His behavior is normal.     Assessment and Plan: Dustin Richard Huynh is a 40 y.o. male who is here today  Rechecked.  Continued medication. Awaiting therapy at this time.   Tremulousness  Anxiety state   Trena PlattStephanie English, PA-C [facility] 07/01/2015 10:06 AM

## 2015-07-02 NOTE — Telephone Encounter (Signed)
Form completed.  Submitted to the front fmla files

## 2015-07-05 MED FILL — FLUoxetine HCL 20 MG TABS: 20 | 30 days supply | Qty: 30 | Fill #3

## 2015-07-05 NOTE — Telephone Encounter (Signed)
Paperwork scanned and faxed on 07/05/15

## 2015-07-10 ENCOUNTER — Ambulatory Visit (INDEPENDENT_AMBULATORY_CARE_PROVIDER_SITE_OTHER): Payer: 59 | Admitting: Physician Assistant

## 2015-07-10 VITALS — BP 120/80 | HR 68 | Temp 98.2°F | Resp 18 | Ht 69.0 in | Wt 203.4 lb

## 2015-07-10 DIAGNOSIS — R251 Tremor, unspecified: Secondary | ICD-10-CM

## 2015-07-10 NOTE — Patient Instructions (Addendum)
     IF you received an x-ray today, you will receive an invoice from [facility]. Please contact [facility] at [phone] with questions or concerns regarding your invoice.   IF you received labwork today, you will receive an invoice from [facility]. Please contact [facility] at [phone] with questions or concerns regarding your invoice.   Our billing staff will not be able to assist you with questions regarding bills from these companies.  You will be contacted with the lab results as soon as they are available. The fastest way to get your results is to activate your My Chart account. Instructions are located on the last page of this paperwork. If you have not heard from us regarding the results in 2 weeks, please contact this office.    Continue to take the 2 tablets of the prozac. Follow up with the psych eval.  They should be assuming care.  Follow the plan as discussed.

## 2015-07-10 NOTE — Progress Notes (Signed)
[facility] [address], [place] [address] [phone]  Date:  07/10/2015   Name:  Dustin Richard   DOB:  02/19/1975   MRN:  [id number]  PCP:  No PCP Per Patient    History of Present Illness:  Dustin Richard is a 40 y.o. male patient who [facility]. Patient reports that his tremulousness has been improving. He is now taking 2 tablets of Prozac (40 mg). He is only needing be hydroxyzine once or twice per week. He has limited his alcohol intake. He continues to take the propranolol.     Patient Active Problem List   Diagnosis Date Noted  . Tremulousness 07/10/2015  . Morbid obesity (HCC) 04/13/2015    Past Medical History  Diagnosis Date  . Migraine     No past surgical history on file.  Social History  Substance Use Topics  . Smoking status: Current Some Day Smoker  . Smokeless tobacco: Never Used  . Alcohol Use: 2.4 - 4.2 oz/week    4-7 Standard drinks or equivalent per week    Family History  Problem Relation Age of Onset  . Diabetes Father   . Hyperlipidemia Father   . Cancer Maternal Grandmother   . Heart disease Maternal Grandfather   . Heart disease Paternal Grandmother   . Hyperlipidemia Paternal Grandmother     No Known Allergies  Medication list has been reviewed and updated.  Current Outpatient Prescriptions on File Prior to Visit  Medication Sig Dispense Refill  . albuterol (PROVENTIL HFA;VENTOLIN HFA) 108 (90 BASE) MCG/ACT inhaler Inhale 2 puffs into the lungs every 4 (four) hours as needed for wheezing or shortness of breath (cough, shortness of breath or wheezing.). 1 Inhaler 1  . FLUoxetine (PROZAC) 20 MG tablet Take 1.5 tablets (30 mg total) by mouth daily. 60 tablet 3  . hydrOXYzine (ATARAX/VISTARIL) 25 MG tablet Take 1-4 tablets (25-100 mg total) by mouth 3 (three) times daily as needed. 120 tablet 1  . lisinopril-hydrochlorothiazide (PRINZIDE,ZESTORETIC) 20-12.5 MG tablet TAKE 1 TABLET  BY MOUTH DAILY. 90 tablet 0  . propranolol ER (INDERAL LA) 80 MG 24 hr capsule TAKE 1 CAPSULE BY MOUTH ONCE DAILY 90 capsule 0  . ondansetron (ZOFRAN-ODT) 8 MG disintegrating tablet Take 1 tablet (8 mg total) by mouth every 8 (eight) hours as needed for nausea. (Patient not taking: Reported on 06/24/2015) 30 tablet 0   No current facility-administered medications on file prior to visit.    ROS ROS otherwise unremarkable unless listed above.  Physical Examination: BP 120/80 mmHg  Pulse 68  Temp(Src) 98.2 F (36.8 C) (Oral)  Resp 18  Ht 5\' 9"  (1.753 m)  Wt 203 lb 6 oz (92.25 kg)  BMI 30.02 kg/m2  SpO2 95% Ideal Body Weight: Weight in (lb) to have BMI = 25: 168.9  Physical Exam  Constitutional: He is oriented to person, place, and time. He appears well-developed and well-nourished. No distress.  HENT:  Head: Normocephalic and atraumatic.  Eyes: Conjunctivae and EOM are normal. Pupils are equal, round, and reactive to light.  Cardiovascular: Normal rate.   Pulmonary/Chest: Effort normal. No respiratory distress.  Neurological: He is alert and oriented to person, place, and time.  Tremulousness was found with fine manipulation.  Skin: Skin is warm and dry. He is not diaphoretic.  Psychiatric: He has a normal mood and affect. His behavior is normal.     Assessment and Plan: Dustin Richard is  a 40 y.o. male who is here today for follow up.  Psych eval is due within 14 days. He will follow-up with them. I do think that it's very much aligned with anxiety.  He needs no refills at this time, however fine to refill the hydroxyzine, propranolol, or the Prozac for the next month. 1. Tremulousness  Trena Platt, PA-C [facility] 07/10/2015 7:15 PM

## 2015-07-23 ENCOUNTER — Ambulatory Visit (INDEPENDENT_AMBULATORY_CARE_PROVIDER_SITE_OTHER): Payer: 59 | Admitting: Physician Assistant

## 2015-07-23 ENCOUNTER — Ambulatory Visit (HOSPITAL_COMMUNITY): Payer: Self-pay | Admitting: Psychiatry

## 2015-07-23 VITALS — BP 122/92 | HR 65 | Temp 97.7°F | Resp 18 | Ht 69.0 in | Wt 206.0 lb

## 2015-07-23 DIAGNOSIS — R079 Chest pain, unspecified: Secondary | ICD-10-CM | POA: Diagnosis not present

## 2015-07-23 DIAGNOSIS — R0602 Shortness of breath: Secondary | ICD-10-CM

## 2015-07-23 LAB — BASIC METABOLIC PANEL
BUN: 10 mg/dL (ref 7–25)
CALCIUM: 8.9 mg/dL (ref 8.6–10.3)
CO2: 27 mmol/L (ref 20–31)
Chloride: 105 mmol/L (ref 98–110)
Creat: 1.07 mg/dL (ref 0.60–1.35)
GLUCOSE: 108 mg/dL — AB (ref 65–99)
Potassium: 4.4 mmol/L (ref 3.5–5.3)
SODIUM: 139 mmol/L (ref 135–146)

## 2015-07-23 NOTE — Progress Notes (Signed)
Urgent Medical and [facility] [address] [place] [address] [phone]  Date:  07/23/2015   Name:  Dustin Richard   DOB:  1975-03-18   MRN:  [id number]  PCP:  No PCP Per Patient    History of Present Illness:  Dustin Richard is a 40 y.o. male patient who presents to [facility] for follow up. He states that he was having sob and chest pains at the right chest, that was a recurring sharp pain.  The pain increased with deep inspiration.  He developed sob and wheezing.  This occurred 3 days ago, and reoccurred 2 days ago and yesterday.  Last for up to half hour.  Yesterday it lasted for about 10 minutes, but moreso just the sob.  Some dizziness at first episode.  No diaphoresis, or nausea.  He was exerting himself with throwing a tennis ball in the heat.  He is getting in about 60-90oz of water per day.    He has quit smoking 07/17/2015.  Chief Complaint  Patient presents with  . Follow-up    for anxiety         Patient Active Problem List   Diagnosis Date Noted  . Tremulousness 07/10/2015  . Morbid obesity (HCC) 04/13/2015    Past Medical History  Diagnosis Date  . Migraine     History reviewed. No pertinent past surgical history.  Social History  Substance Use Topics  . Smoking status: Current Some Day Smoker  . Smokeless tobacco: Never Used  . Alcohol Use: 2.4 - 4.2 oz/week    4-7 Standard drinks or equivalent per week    Family History  Problem Relation Age of Onset  . Diabetes Father   . Hyperlipidemia Father   . Cancer Maternal Grandmother   . Heart disease Maternal Grandfather   . Heart disease Paternal Grandmother   . Hyperlipidemia Paternal Grandmother     No Known Allergies  Medication list has been reviewed and updated.  Current Outpatient Prescriptions on File Prior to Visit  Medication Sig Dispense Refill  . albuterol (PROVENTIL HFA;VENTOLIN HFA) 108 (90 BASE) MCG/ACT inhaler Inhale 2 puffs into the lungs every 4 (four) hours as needed for  wheezing or shortness of breath (cough, shortness of breath or wheezing.). 1 Inhaler 1  . FLUoxetine (PROZAC) 20 MG tablet Take 1.5 tablets (30 mg total) by mouth daily. 60 tablet 3  . hydrOXYzine (ATARAX/VISTARIL) 25 MG tablet Take 1-4 tablets (25-100 mg total) by mouth 3 (three) times daily as needed. 120 tablet 1  . lisinopril-hydrochlorothiazide (PRINZIDE,ZESTORETIC) 20-12.5 MG tablet TAKE 1 TABLET BY MOUTH DAILY. 90 tablet 0  . propranolol ER (INDERAL LA) 80 MG 24 hr capsule TAKE 1 CAPSULE BY MOUTH ONCE DAILY 90 capsule 0  . ondansetron (ZOFRAN-ODT) 8 MG disintegrating tablet Take 1 tablet (8 mg total) by mouth every 8 (eight) hours as needed for nausea. (Patient not taking: Reported on 06/24/2015) 30 tablet 0   No current facility-administered medications on file prior to visit.    ROS ROS otherwise unremarkable unless listed above.  Physical Examination: BP 122/92 mmHg  Pulse 65  Temp(Src) 97.7 F (36.5 C) (Oral)  Resp 18  Ht  (1.753 m)  Wt 206 lb (93.441 kg)  BMI 30.41 kg/m2  SpO2 96% Ideal Body Weight: Weight in (lb) to have BMI = 25: 168.9  Physical Exam  Constitutional: He is oriented to person, place, and time. He appears well-developed and well-nourished. No distress.  HENT:  Head: Normocephalic and atraumatic.  Eyes: Conjunctivae and EOM are normal. Pupils are equal, round, and reactive to light.  Cardiovascular: Normal rate and regular rhythm.  Exam reveals no friction rub.   No murmur heard. Pulmonary/Chest: Effort normal. No respiratory distress. He has no wheezes.  Neurological: He is alert and oriented to person, place, and time.  Skin: Skin is warm and dry. He is not diaphoretic.  Psychiatric: He has a normal mood and affect. His behavior is normal.     Assessment and Plan: [name] is a 40 y.o. male who is here today for follow up and chest pain with sob.   Possibly secondary to anxiety.  Chest pain, unspecified chest pain type - Plan: EKG  12-Lead, Basic metabolic panel  SOB (shortness of breath) - Plan: EKG 12-Lead, Basic metabolic panel  Trena PlattStephanie English, PA-C [facility] 07/23/2015 4:00 PM

## 2015-07-23 NOTE — Patient Instructions (Addendum)
     IF you received an x-ray today, you will receive an invoice from [facility]. Please contact [facility] at [phone] with questions or concerns regarding your invoice.   IF you received labwork today, you will receive an invoice from [facility]. Please contact [facility] at [phone] with questions or concerns regarding your invoice.   Our billing staff will not be able to assist you with questions regarding bills from these companies.  You will be contacted with the lab results as soon as they are available. The fastest way to get your results is to activate your My Chart account. Instructions are located on the last page of this paperwork. If you have not heard from us regarding the results in 2 weeks, please contact this office.    Let's follow up in 2 weeks.  I am contacting referrals to place a new follow up for this psych eval.  Keep the appointment, and we will see if we can go elsewhere in the meantime.   This does not seem cardiac related.  Let's watch this over the next week.  If it continues, or worsens--please return immediately, and we will seek cardiac consult.

## 2015-07-27 ENCOUNTER — Telehealth: Payer: Self-pay

## 2015-07-27 MED FILL — FLUoxetine HCL 20 MG TABS: 20 | 30 days supply | Qty: 45 | Fill #0

## 2015-07-27 NOTE — Telephone Encounter (Signed)
Patient needs FMLA forms updated for his employer to allow him time off from his last OV through his follow up on 08/06/15/ I have completed what I could from the notes and highlighted the areas that need to be completed. I will place them in your box on 07/27/15 if you could return them to the FMLA/Disability box at the 102 checkout desk with in 5-7 business days. Thank you!

## 2015-07-27 NOTE — Telephone Encounter (Signed)
Placed in the fmla/disabilility basket.  Please make sure sent correctly.  If you want to extend the days to 08/13/2015--that would be good.  Lest he is not cleared by then.

## 2015-07-28 NOTE — Telephone Encounter (Signed)
Paperwork scanned and faxed to [employer] on 07/28/15

## 2015-07-30 DIAGNOSIS — Z0271 Encounter for disability determination: Secondary | ICD-10-CM

## 2015-08-09 ENCOUNTER — Ambulatory Visit (INDEPENDENT_AMBULATORY_CARE_PROVIDER_SITE_OTHER): Payer: 59 | Admitting: Physician Assistant

## 2015-08-09 DIAGNOSIS — F411 Generalized anxiety disorder: Secondary | ICD-10-CM | POA: Diagnosis not present

## 2015-08-09 DIAGNOSIS — R251 Tremor, unspecified: Secondary | ICD-10-CM

## 2015-08-09 MED ORDER — HYDROXYZINE HCL 25 MG PO TABS: 25.0000 mg | ORAL_TABLET | Freq: Three times a day (TID) | ORAL | 1 refills | Status: AC | PRN

## 2015-08-09 MED ORDER — PROPRANOLOL HCL ER 80 MG PO CP24: 80.0000 mg | ORAL_CAPSULE | Freq: Every day | ORAL | 0 refills | Status: AC

## 2015-08-09 MED FILL — hydrOXYzine HCL 25 MG TABS: 25 | 10 days supply | Qty: 120 | Fill #0

## 2015-08-09 NOTE — Patient Instructions (Addendum)
  Continue medication as prescribed.  Follow up in 2 weeks, unless appointment can be scheduled in the meantime.   IF you received an x-ray today, you will receive an invoice from [facility]. Please contact [facility] at [phone] with questions or concerns regarding your invoice.   IF you received labwork today, you will receive an invoice from [facility]. Please contact [facility] at [phone] with questions or concerns regarding your invoice.   Our billing staff will not be able to assist you with questions regarding bills from these companies.  You will be contacted with the lab results as soon as they are available. The fastest way to get your results is to activate your My Chart account. Instructions are located on the last page of this paperwork. If you have not heard from us regarding the results in 2 weeks, please contact this office.

## 2015-08-11 MED FILL — LISINOPRIL-HCTZ 20-12.5 MG: 20-12.5 | 90 days supply | Qty: 90 | Fill #0

## 2015-08-12 ENCOUNTER — Telehealth: Payer: Self-pay

## 2015-08-12 ENCOUNTER — Other Ambulatory Visit: Payer: Self-pay

## 2015-08-12 DIAGNOSIS — F411 Generalized anxiety disorder: Secondary | ICD-10-CM

## 2015-08-12 NOTE — Telephone Encounter (Signed)
Judeth CornfieldStephanie, we received a [facility] stating that pt reports his fluoxetine dose has gone up to 2 tabs daily, instead of 1.5 tabs. Your OV notes aren't complete yet, but the dose on his AVS is still at 1.5 tabs QD. Please advise. I will pend it this way.

## 2015-08-12 NOTE — Telephone Encounter (Signed)
[employer] needs behavioral forms filled out on patient based off his last OV I have completed what I could from the notes and I will place the forms in your box for you to complete on 08/12/15 if you could return them to the FMLA/Disability box at the 102 checkout desk within 5-7 business days. Thank you!

## 2015-08-13 MED ORDER — FLUOXETINE HCL 20 MG PO TABS: 40.0000 mg | ORAL_TABLET | Freq: Every day | ORAL | 1 refills | Status: AC

## 2015-08-13 NOTE — Telephone Encounter (Signed)
Placed in fmla disability box, completed.

## 2015-08-16 MED FILL — FLUoxetine HCL 20 MG TABS: 20 | 90 days supply | Qty: 180 | Fill #0

## 2015-08-16 NOTE — Telephone Encounter (Signed)
Paperwork scanned and faxed on 08/16/15 °

## 2015-08-17 ENCOUNTER — Encounter: Payer: Self-pay | Admitting: Physician Assistant

## 2015-08-17 NOTE — Progress Notes (Signed)
[facility] [address], [place] [address] [phone]- [phone]  Date:  08/09/2015   Name:  Dustin Richard   DOB:  07/02/1975   MRN:  [id number]  PCP:  Trena PlattStephanie Briea Mcenery, PA    History of Present Illness:  Dustin Richard is a 40 y.o. male patient who [facility].  Patient continues to exhibit bouts of tremulousness.  He is taking the vistaril when he has sudden bouts of tremulousness.  He states that he feels anxious.  The vistaril will help.  He continues to take the Prozac and finds efficacy in the dosage of 40 mg daily.  He feels as if he is less worried, and not in his thoughts. He denies si/hi.  He is also taking the propanolol.      Patient Active Problem List   Diagnosis Date Noted  . Tremulousness 07/10/2015  . Morbid obesity (HCC) 04/13/2015    Past Medical History:  Diagnosis Date  . Migraine     No past surgical history on file.  Social History  Substance Use Topics  . Smoking status: Current Some Day Smoker  . Smokeless tobacco: Never Used  . Alcohol use 2.4 - 4.2 oz/week    4 - 7 Standard drinks or equivalent per week    Family History  Problem Relation Age of Onset  . Diabetes Father   . Hyperlipidemia Father   . Cancer Maternal Grandmother   . Heart disease Maternal Grandfather   . Heart disease Paternal Grandmother   . Hyperlipidemia Paternal Grandmother     No Known Allergies  Medication list has been reviewed and updated.  Current Outpatient Prescriptions on File Prior to Visit  Medication Sig Dispense Refill  . albuterol (PROVENTIL HFA;VENTOLIN HFA) 108 (90 BASE) MCG/ACT inhaler Inhale 2 puffs into the lungs every 4 (four) hours as needed for wheezing or shortness of breath (cough, shortness of breath or wheezing.). 1 Inhaler 1  . lisinopril-hydrochlorothiazide (PRINZIDE,ZESTORETIC) 20-12.5 MG tablet TAKE 1 TABLET BY MOUTH DAILY. 90 tablet 0  . ondansetron (ZOFRAN-ODT) 8 MG disintegrating tablet Take 1 tablet  (8 mg total) by mouth every 8 (eight) hours as needed for nausea. 30 tablet 0   No current facility-administered medications on file prior to visit.     ROS ROS otherwise unremarkable unless listed above.   Physical Examination: BP 118/82 (BP Location: Left Arm, Patient Position: Sitting, Cuff Size: Normal)   Pulse 66   Temp 97.7 F (36.5 C) (Oral)   Resp 18   Ht 5\' 9"  (1.753 m)   Wt 207 lb (93.9 kg)   SpO2 94%   BMI 30.57 kg/m  Ideal Body Weight: Weight in (lb) to have BMI = 25: 168.9  Physical Exam  Constitutional: He is oriented to person, place, and time. He appears well-developed and well-nourished. No distress.  HENT:  Head: Normocephalic and atraumatic.  Eyes: Conjunctivae and EOM are normal. Pupils are equal, round, and reactive to light.  Cardiovascular: Normal rate.   Pulmonary/Chest: Effort normal. No respiratory distress.  Neurological: He is alert and oriented to person, place, and time.  Skin: Skin is warm and dry. He is not diaphoretic.  Psychiatric: He has a normal mood and affect. His behavior is normal.     Assessment and Plan: Dustin Richard is a 40 y.o. male who is here today for cc of anxiety. Refill given.  We are awaiting visit to psychiatrist at this time in September.  In 2 weeks when he returns--if he is having no digression--we should initiate half day  Tremor - Plan: hydrOXYzine (ATARAX/VISTARIL) 25 MG tablet  Anxiety state - Plan: hydrOXYzine (ATARAX/VISTARIL) 25 MG tablet  Trena PlattStephanie Romesha Scherer, PA-C [facility] 08/17/2015 7:28 AM

## 2015-08-19 ENCOUNTER — Telehealth: Payer: Self-pay

## 2015-08-19 NOTE — Telephone Encounter (Signed)
Refer to call from 08/12/15-[employer] says they didn't receive FMLA/Disability paperwork o 08/16/15. Dustin Richard would like these papers faxed back over. Fax #[phone]. Notes go back to 07/09/15

## 2015-08-20 ENCOUNTER — Ambulatory Visit (INDEPENDENT_AMBULATORY_CARE_PROVIDER_SITE_OTHER): Payer: 59 | Admitting: Physician Assistant

## 2015-08-20 VITALS — BP 116/84 | HR 60 | Temp 97.5°F | Resp 16 | Ht 68.0 in | Wt 207.0 lb

## 2015-08-20 DIAGNOSIS — R251 Tremor, unspecified: Secondary | ICD-10-CM

## 2015-08-20 DIAGNOSIS — Z09 Encounter for follow-up examination after completed treatment for conditions other than malignant neoplasm: Secondary | ICD-10-CM | POA: Diagnosis not present

## 2015-08-20 NOTE — Patient Instructions (Addendum)
Please return Friday, 08/27/2015    IF you received an x-ray today, you will receive an invoice from [facility]. Please contact [facility] at [phone] with questions or concerns regarding your invoice.   IF you received labwork today, you will receive an invoice from [facility]. Please contact [facility] at [phone] with questions or concerns regarding your invoice.   Our billing staff will not be able to assist you with questions regarding bills from these companies.  You will be contacted with the lab results as soon as they are available. The fastest way to get your results is to activate your My Chart account. Instructions are located on the last page of this paperwork. If you have not heard from us regarding the results in 2 weeks, please contact this office.

## 2015-08-20 NOTE — Progress Notes (Addendum)
Patient ID: Dustin Richard, male   DOB: 08/07/1975, 40 y.o.   MRN: [id number] [facility] [address], [place] [address] [name]  Date:  08/20/2015   Name:  Dustin CalenderMatthew W Richard   DOB:  04/15/1975   MRN:  [id number]  PCP:  Trena PlattStephanie English, PA   By signing my name below, I, Charline BillsEssence Howell, attest that this documentation has been prepared under the direction and in the presence of Trena PlattStephanie English, PA-C Electronically Signed: Charline BillsEssence Howell, ED Scribe 08/20/2015 at 12:45 PM.  History of Present Illness:  Dustin Richard is a 40 y.o. male, with a h/o tremulousness, who [facility] follow-up regarding tremor and paperwork for insurance. Pt states that his symptoms have been pretty consistent since he was seen 11 days ago. He requests a copy of his medical notes at this visit; states that his short-term case with [employer] expired on 08/09/15. Per chart review, pt's FMLA forms were placed in the disability box on 08/13/15, scanned and faxed on 08/16/15. Pt agrees to return to work for 4 days/week.   Patient Active Problem List   Diagnosis Date Noted   Tremulousness 07/10/2015   Morbid obesity (HCC) 04/13/2015    Past Medical History:  Diagnosis Date   Migraine     No past surgical history on file.  Social History  Substance Use Topics   Smoking status: Current Some Day Smoker   Smokeless tobacco: Never Used   Alcohol use 2.4 - 4.2 oz/week    4 - 7 Standard drinks or equivalent per week    Family History  Problem Relation Age of Onset   Diabetes Father    Hyperlipidemia Father    Cancer Maternal Grandmother    Heart disease Maternal Grandfather    Heart disease Paternal Grandmother    Hyperlipidemia Paternal Grandmother     No Known Allergies  Medication list has been reviewed and updated.  Current Outpatient Prescriptions on File Prior to Visit  Medication Sig Dispense Refill   albuterol (PROVENTIL HFA;VENTOLIN HFA) 108 (90 BASE)  MCG/ACT inhaler Inhale 2 puffs into the lungs every 4 (four) hours as needed for wheezing or shortness of breath (cough, shortness of breath or wheezing.). 1 Inhaler 1   FLUoxetine (PROZAC) 20 MG tablet Take 2 tablets (40 mg total) by mouth daily. 180 tablet 1   hydrOXYzine (ATARAX/VISTARIL) 25 MG tablet Take 1-4 tablets (25-100 mg total) by mouth 3 (three) times daily as needed. 120 tablet 1   lisinopril-hydrochlorothiazide (PRINZIDE,ZESTORETIC) 20-12.5 MG tablet TAKE 1 TABLET BY MOUTH DAILY. 90 tablet 0   ondansetron (ZOFRAN-ODT) 8 MG disintegrating tablet Take 1 tablet (8 mg total) by mouth every 8 (eight) hours as needed for nausea. 30 tablet 0   propranolol ER (INDERAL LA) 80 MG 24 hr capsule Take 1 capsule (80 mg total) by mouth daily. 90 capsule 0   No current facility-administered medications on file prior to visit.     Review of Systems  Neurological: Positive for tremors.   Physical Examination: BP 116/84 (BP Location: Right Arm, Patient Position: Sitting, Cuff Size: Large)    Pulse 60    Temp 97.5 F (36.4 C) (Oral)    Resp 16    Ht 5\' 8"  (1.727 m)    Wt 207 lb (93.9 kg)    SpO2 97%    BMI 31.47 kg/m  Ideal Body Weight: @FLOWAMB ([id number])@[phone])@   Physical Exam  Constitutional: He is oriented to person, place,  and time. He appears well-developed and well-nourished. No distress.  HENT:  Head: Normocephalic and atraumatic.  Eyes: Conjunctivae and EOM are normal. Pupils are equal, round, and reactive to light.  Cardiovascular: Normal rate, regular rhythm, normal heart sounds and normal pulses.  Exam reveals no gallop and no friction rub.   No murmur heard. Pulmonary/Chest: Effort normal and breath sounds normal. No respiratory distress. He has no wheezes. He has no rhonchi. He has no rales.  Neurological: He is alert and oriented to person, place, and time.  Very mild tremulousness with fine manipulation   Skin: Skin is warm and dry. He is not diaphoretic.  Psychiatric: He has  a normal mood and affect. His behavior is normal.    Assessment and Plan: Dustin Richard is a 40 y.o. male who is here today for follow up of tremulousness. --though his symptoms continue, they are much better controlled.  It would be best not in the ball of the tremulousness but at just to these symptoms at work. I am placing him part time. He will work 4 hours per day a letter was given. (See letter) He will return in 7 days for follow up, unless there is concern to warrant a more immediate return. Tremulousness  Follow up  Trena PlattStephanie English, PA-C [facility] 08/20/2015 12:45 PM  I personally performed the services described in this documentation, which was scribed in my presence. The recorded information has been reviewed and is accurate.

## 2015-08-23 NOTE — Telephone Encounter (Signed)
Notes re-faxed on 08/23/15 to [phone]

## 2015-09-01 ENCOUNTER — Ambulatory Visit (INDEPENDENT_AMBULATORY_CARE_PROVIDER_SITE_OTHER): Payer: 59 | Admitting: Physician Assistant

## 2015-09-01 VITALS — BP 110/70 | HR 60 | Temp 97.7°F | Resp 17 | Ht 68.0 in | Wt 212.0 lb

## 2015-09-01 DIAGNOSIS — Z09 Encounter for follow-up examination after completed treatment for conditions other than malignant neoplasm: Secondary | ICD-10-CM | POA: Diagnosis not present

## 2015-09-01 DIAGNOSIS — R251 Tremor, unspecified: Secondary | ICD-10-CM

## 2015-09-01 NOTE — Progress Notes (Signed)
Patient ID: Dustin Richard Richard, male   DOB: 07/10/1975, 40 y.o.   MRN: [id number] [facility] [address], [place] [address] [phone]  Date:  09/01/2015   Name:  Dustin Richard Pamer   DOB:  08/24/1975   MRN:  [id number]  PCP:  Trena PlattStephanie English, PA   By signing my name below, I, Charline BillsEssence Howell, attest that this documentation has been prepared under the direction and in the presence of Trena PlattStephanie English, PA-C Electronically Signed: Charline BillsEssence Howell, ED Scribe 09/01/2015 at 12:56 PM.  History of Present Illness:  Dustin Richard is a 40 y.o. male patient, with a h/o tremulousness, who [facility]. Pt states that he has not returned to work since his last visit. He states that he feels as if he can work but he has not been contacted by his employer [employer] to return to work. He expresses concern of them listing his job as an opening since he has not been to work in a while. Pt is currently taking 40 mg Prozac and 80 mg propranolol. He has an upcoming appointment on 09/10/15.  His tremulousness has improved.  He has rare tremulous episodes.  Compliant to all meds.  No report of side effects.  Patient Active Problem List   Diagnosis Date Noted   Tremulousness 07/10/2015   Morbid obesity (HCC) 04/13/2015    Past Medical History:  Diagnosis Date   Migraine     No past surgical history on file.  Social History  Substance Use Topics   Smoking status: Former Smoker   Smokeless tobacco: Never Used   Alcohol use 2.4 - 4.2 oz/week    4 - 7 Standard drinks or equivalent per week    Family History  Problem Relation Age of Onset   Diabetes Father    Hyperlipidemia Father    Cancer Maternal Grandmother    Heart disease Maternal Grandfather    Heart disease Paternal Grandmother    Hyperlipidemia Paternal Grandmother     No Known Allergies  Medication list has been reviewed and updated.  Current Outpatient Prescriptions on File Prior to  Visit  Medication Sig Dispense Refill   albuterol (PROVENTIL HFA;VENTOLIN HFA) 108 (90 BASE) MCG/ACT inhaler Inhale 2 puffs into the lungs every 4 (four) hours as needed for wheezing or shortness of breath (cough, shortness of breath or wheezing.). 1 Inhaler 1   FLUoxetine (PROZAC) 20 MG tablet Take 2 tablets (40 mg total) by mouth daily. 180 tablet 1   hydrOXYzine (ATARAX/VISTARIL) 25 MG tablet Take 1-4 tablets (25-100 mg total) by mouth 3 (three) times daily as needed. 120 tablet 1   lisinopril-hydrochlorothiazide (PRINZIDE,ZESTORETIC) 20-12.5 MG tablet TAKE 1 TABLET BY MOUTH DAILY. 90 tablet 0   ondansetron (ZOFRAN-ODT) 8 MG disintegrating tablet Take 1 tablet (8 mg total) by mouth every 8 (eight) hours as needed for nausea. 30 tablet 0   propranolol ER (INDERAL LA) 80 MG 24 hr capsule Take 1 capsule (80 mg total) by mouth daily. 90 capsule 0   No current facility-administered medications on file prior to visit.     Review of Systems  Neurological: Negative for tremors.    Physical Examination: BP 110/70 (BP Location: Right Arm, Patient Position: Sitting, Cuff Size: Normal)    Pulse 60    Temp 97.7 F (36.5 C) (Oral)    Resp 17    Ht 5\' 8"  (1.727 m)    Wt 212 lb (96.2 kg)  SpO2 97%    BMI 32.23 kg/m  Ideal Body Weight: @FLOWAMB ([id number])@  Physical Exam  Constitutional: He is oriented to person, place, and time. He appears well-developed and well-nourished. No distress.  HENT:  Head: Normocephalic and atraumatic.  Eyes: Conjunctivae and EOM are normal. Pupils are equal, round, and reactive to light.  Cardiovascular: Normal rate and regular rhythm.  Exam reveals no gallop and no friction rub.   No murmur heard. Pulmonary/Chest: Effort normal and breath sounds normal. No respiratory distress. He has no wheezes. He has no rhonchi. He has no rales.  Neurological: He is alert and oriented to person, place, and time.  Fine manipulation. Normal finger to nose. No tremulousness  noted with task.   Skin: Skin is warm and dry. He is not diaphoretic.  Psychiatric: He has a normal mood and affect. His behavior is normal.   Assessment and Plan: [name] is a 40 y.o. male who is here today for follow up of tremulousness. --anxiety has improved.  He states to be able to go back to work full time.  Letter given to go back to work full-time. Continue meds.  He will return in 2 weeks.  Patient's appt. Is in 10 days. Tremulousness  Follow up  Trena Platt, PA-C [facility] 8/31/20176:46 PM

## 2015-09-01 NOTE — Patient Instructions (Signed)
     IF you received an x-ray today, you will receive an invoice from [facility]. Please contact [facility] at [phone] with questions or concerns regarding your invoice.   IF you received labwork today, you will receive an invoice from [facility]. Please contact [facility] at [phone] with questions or concerns regarding your invoice.   Our billing staff will not be able to assist you with questions regarding bills from these companies.  You will be contacted with the lab results as soon as they are available. The fastest way to get your results is to activate your My Chart account. Instructions are located on the last page of this paperwork. If you have not heard from us regarding the results in 2 weeks, please contact this office.      

## 2015-09-10 ENCOUNTER — Ambulatory Visit (HOSPITAL_COMMUNITY): Payer: Self-pay | Admitting: Psychiatry

## 2016-03-20 ENCOUNTER — Ambulatory Visit (INDEPENDENT_AMBULATORY_CARE_PROVIDER_SITE_OTHER): Payer: 59 | Admitting: Physician Assistant

## 2016-03-20 ENCOUNTER — Ambulatory Visit (INDEPENDENT_AMBULATORY_CARE_PROVIDER_SITE_OTHER): Payer: 59

## 2016-03-20 VITALS — BP 146/106 | HR 90 | Temp 99.0°F | Resp 16 | Ht 67.5 in | Wt 257.0 lb

## 2016-03-20 DIAGNOSIS — R079 Chest pain, unspecified: Secondary | ICD-10-CM | POA: Diagnosis not present

## 2016-03-20 DIAGNOSIS — J189 Pneumonia, unspecified organism: Secondary | ICD-10-CM | POA: Diagnosis not present

## 2016-03-20 DIAGNOSIS — R0609 Other forms of dyspnea: Secondary | ICD-10-CM

## 2016-03-20 DIAGNOSIS — I10 Essential (primary) hypertension: Secondary | ICD-10-CM | POA: Diagnosis not present

## 2016-03-20 DIAGNOSIS — R059 Cough, unspecified: Secondary | ICD-10-CM

## 2016-03-20 DIAGNOSIS — J209 Acute bronchitis, unspecified: Secondary | ICD-10-CM

## 2016-03-20 DIAGNOSIS — R05 Cough: Secondary | ICD-10-CM

## 2016-03-20 LAB — POCT URINALYSIS DIP (MANUAL ENTRY)
Bilirubin, UA: NEGATIVE
Glucose, UA: NEGATIVE
Ketones, POC UA: NEGATIVE
Leukocytes, UA: NEGATIVE
NITRITE UA: NEGATIVE
RBC UA: NEGATIVE
Spec Grav, UA: 1.02 (ref 1.030–1.035)
Urobilinogen, UA: 0.2 (ref ?–2.0)
pH, UA: 5.5 (ref 5.0–8.0)

## 2016-03-20 MED ORDER — AZITHROMYCIN 250 MG PO TABS: ORAL_TABLET | ORAL | 0 refills | Status: AC

## 2016-03-20 MED ORDER — ALBUTEROL SULFATE HFA 108 (90 BASE) MCG/ACT IN AERS: 2.0000 | INHALATION_SPRAY | RESPIRATORY_TRACT | 1 refills | Status: AC | PRN

## 2016-03-20 MED ORDER — LISINOPRIL-HYDROCHLOROTHIAZIDE 20-12.5 MG PO TABS: 1.0000 | ORAL_TABLET | Freq: Every day | ORAL | 1 refills | Status: DC

## 2016-03-20 MED ORDER — GUAIFENESIN ER 1200 MG PO TB12: 1.0000 | ORAL_TABLET | Freq: Two times a day (BID) | ORAL | 1 refills | Status: AC | PRN

## 2016-03-20 NOTE — Progress Notes (Signed)
PRIMARY CARE AT [place], [place] [address] [phone]  Date:  03/20/2016   Name:  Dustin Richard   DOB:  1975-01-28   MRN:  [id number]  PCP:  Ivar Drape, PA    History of Present Illness:  Dustin Richard is a 41 y.o. male patient who presents to PCP with  Chief Complaint  Patient presents with  . High blood Pressure  . Shortness of Breath    started last  night  . Cough    1-2 days     Patient is here today with cc of sob.  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  He had to stop twice due to sob.  Patient would be sob very quickly last night.  Last 1-2 days, he has a cough which is non-productive.  No nasal congestion.  Since last night, he is wheezing.  Went to see nurse, and 173/112 at the job.  He is currently out of bp medications.  He has chest pain worsens with deep inhalation.  Feels like there is pressure from both sides.  He had diaphoresis last night at work, but he generally is sweating with his work.  No nausea.  He had some dizziness.     Wt Readings from Last 3 Encounters:  03/20/16 257 lb (116.6 kg)  09/01/15 212 lb (96.2 kg)  08/20/15 207 lb (93.9 kg)      Patient Active Problem List   Diagnosis Date Noted  . Tremulousness 07/10/2015  . Morbid obesity ([place]) 04/13/2015    Past Medical History:  Diagnosis Date  . Migraine     No past surgical history on file.  Social History  Substance Use Topics  . Smoking status: Former Research scientist (life sciences)  . Smokeless tobacco: Never Used  . Alcohol use 2.4 - 4.2 oz/week    4 - 7 Standard drinks or equivalent per week    Family History  Problem Relation Age of Onset  . Diabetes Father   . Hyperlipidemia Father   . Cancer Maternal Grandmother   . Heart disease Maternal Grandfather   . Heart disease Paternal Grandmother   . Hyperlipidemia Paternal Grandmother     No Known Allergies  Medication list has been reviewed and updated.  Current Outpatient  Prescriptions on File Prior to Visit  Medication Sig Dispense Refill  . albuterol (PROVENTIL HFA;VENTOLIN HFA) 108 (90 BASE) MCG/ACT inhaler Inhale 2 puffs into the lungs every 4 (four) hours as needed for wheezing or shortness of breath (cough, shortness of breath or wheezing.). (Patient not taking: Reported on 03/20/2016) 1 Inhaler 1  . FLUoxetine (PROZAC) 20 MG tablet Take 2 tablets (40 mg total) by mouth daily. (Patient not taking: Reported on 03/20/2016) 180 tablet 1  . hydrOXYzine (ATARAX/VISTARIL) 25 MG tablet Take 1-4 tablets (25-100 mg total) by mouth 3 (three) times daily as needed. (Patient not taking: Reported on 03/20/2016) 120 tablet 1  . lisinopril-hydrochlorothiazide (PRINZIDE,ZESTORETIC) 20-12.5 MG tablet TAKE 1 TABLET BY MOUTH DAILY. (Patient not taking: Reported on 03/20/2016) 90 tablet 0  . ondansetron (ZOFRAN-ODT) 8 MG disintegrating tablet Take 1 tablet (8 mg total) by mouth every 8 (eight) hours as needed for nausea. (Patient not taking: Reported on 03/20/2016) 30 tablet 0  . propranolol ER (INDERAL LA) 80 MG 24 hr capsule Take 1 capsule (80 mg total) by mouth daily. (Patient not taking: Reported on 03/20/2016) 90 capsule 0   No current facility-administered  medications on file prior to visit.     ROS ROS otherwise unremarkable unless listed above.  Physical Examination: BP 130/90 (BP Location: Right Arm, Cuff Size: Large)   Pulse 97   Temp 99 F (37.2 C) (Oral)   Resp 16   Ht 5' 7.5" (1.715 m)   Wt 257 lb (116.6 kg)   SpO2 94%   PF 320 L/min   BMI 39.66 kg/m  Ideal Body Weight: Weight in (lb) to have BMI = 25: 161.7  Physical Exam  Constitutional: He is oriented to person, place, and time. He appears well-developed and well-nourished. No distress.  HENT:  Head: Normocephalic and atraumatic.  Eyes: Conjunctivae and EOM are normal. Pupils are equal, round, and reactive to light.  Cardiovascular: Normal rate and regular rhythm.  Exam reveals no distant heart sounds  and no friction rub.   No murmur heard. Pulses:      Radial pulses are 2+ on the right side, and 2+ on the left side.       Dorsalis pedis pulses are 2+ on the right side, and 2+ on the left side.  Pulmonary/Chest: Effort normal and breath sounds normal. No respiratory distress. He has no wheezes.  Neurological: He is alert and oriented to person, place, and time.  Skin: Skin is warm and dry. He is not diaphoretic.  Psychiatric: He has a normal mood and affect. His behavior is normal.    ekg: no acute changes.   Dg Chest 2 View  Result Date: 03/20/2016 CLINICAL DATA:  Shortness of breath . EXAM: CHEST  2 VIEW COMPARISON:  No recent prior . FINDINGS: Mediastinum and hilar structures normal. Mild peribronchial cuffing. Mild bronchitis cannot be excluded. Mild right middle lobe subsegmental atelectasis . No pleural effusion or pneumothorax. Heart size normal. IMPRESSION: Mild peribronchial cuffing. Mild bronchitis cannot be excluded. Mild right middle lobe subsegmental atelectasis . Electronically Signed   By: Marcello Moores  Register   On: 03/20/2016 10:11    Assessment and Plan: Dustin Richard is a 41 y.o. male who is here today for sob and cough.. Likely cap.  Given abx at this time.  Will cover for atypical Refilling htn med.  Advised to check twice per week and report if not under 140/90.  Precautions advised.  Dyspnea on exertion - Plan: EKG 12-Lead, Pulse oximetry (single), POCT urinalysis dipstick, DG Chest 2 View, CMP14+EGFR  Chest pain, unspecified type - Plan: EKG 12-Lead, Pulse oximetry (single), POCT urinalysis dipstick, DG Chest 2 View, CMP14+EGFR  Community acquired pneumonia of right lung, unspecified part of lung - Plan: azithromycin (ZITHROMAX) 250 MG tablet, Guaifenesin (MUCINEX MAXIMUM STRENGTH) 1200 MG TB12, CMP14+EGFR  Essential hypertension - Plan: lisinopril-hydrochlorothiazide (PRINZIDE,ZESTORETIC) 20-12.5 MG tablet  Cough - Plan: albuterol (PROVENTIL HFA;VENTOLIN HFA) 108  (90 Base) MCG/ACT inhaler  Bronchospasm with bronchitis, acute - Plan: albuterol (PROVENTIL HFA;VENTOLIN HFA) 108 (90 Base) MCG/ACT inhaler  Ivar Drape, PA-C [facility] 3/19/20183:36 PM

## 2016-03-20 NOTE — Patient Instructions (Addendum)
 DASH Eating Plan DASH stands for "Dietary Approaches to Stop Hypertension." The DASH eating plan is a healthy eating plan that has been shown to reduce high blood pressure (hypertension). It may also reduce your risk for type 2 diabetes, heart disease, and stroke. The DASH eating plan may also help with weight loss. What are tips for following this plan? General guidelines  Avoid eating more than 2,300 mg (milligrams) of salt (sodium) a day. If you have hypertension, you may need to reduce your sodium intake to 1,500 mg a day.  Limit alcohol intake to no more than 1 drink a day for nonpregnant women and 2 drinks a day for men. One drink equals 12 oz of beer, 5 oz of wine, or 1 oz of hard liquor.  Work with your health care provider to maintain a healthy body weight or to lose weight. Ask what an ideal weight is for you.  Get at least 30 minutes of exercise that causes your heart to beat faster (aerobic exercise) most days of the week. Activities may include walking, swimming, or biking.  Work with your health care provider or diet and nutrition specialist (dietitian) to adjust your eating plan to your individual calorie needs. Reading food labels  Check food labels for the amount of sodium per serving. Choose foods with less than 5 percent of the Daily Value of sodium. Generally, foods with less than 300 mg of sodium per serving fit into this eating plan.  To find whole grains, look for the word "whole" as the first word in the ingredient list. Shopping  Buy products labeled as "low-sodium" or "no salt added."  Buy fresh foods. Avoid canned foods and premade or frozen meals. Cooking  Avoid adding salt when cooking. Use salt-free seasonings or herbs instead of table salt or sea salt. Check with your health care provider or pharmacist before using salt substitutes.  Do not fry foods. Cook foods using healthy methods such as baking, boiling, grilling, and broiling instead.  Cook with  heart-healthy oils, such as olive, canola, soybean, or sunflower oil. Meal planning   Eat a balanced diet that includes: ? 5 or more servings of fruits and vegetables each day. At each meal, try to fill half of your plate with fruits and vegetables. ? Up to 6-8 servings of whole grains each day. ? Less than 6 oz of lean meat, poultry, or fish each day. A 3-oz serving of meat is about the same size as a deck of cards. One egg equals 1 oz. ? 2 servings of low-fat dairy each day. ? A serving of nuts, seeds, or beans 5 times each week. ? Heart-healthy fats. Healthy fats called Omega-3 fatty acids are found in foods such as flaxseeds and coldwater fish, like sardines, salmon, and mackerel.  Limit how much you eat of the following: ? Canned or prepackaged foods. ? Food that is high in trans fat, such as fried foods. ? Food that is high in saturated fat, such as fatty meat. ? Sweets, desserts, sugary drinks, and other foods with added sugar. ? Full-fat dairy products.  Do not salt foods before eating.  Try to eat at least 2 vegetarian meals each week.  Eat more home-cooked food and less restaurant, buffet, and fast food.  When eating at a restaurant, ask that your food be prepared with less salt or no salt, if possible. What foods are recommended? The items listed may not be a complete list. Talk with your dietitian about   what dietary choices are best for you. Grains Whole-grain or whole-wheat bread. Whole-grain or whole-wheat pasta. Brown rice. Oatmeal. Quinoa. Bulgur. Whole-grain and low-sodium cereals. Pita bread. Low-fat, low-sodium crackers. Whole-wheat flour tortillas. Vegetables Fresh or frozen vegetables (raw, steamed, roasted, or grilled). Low-sodium or reduced-sodium tomato and vegetable juice. Low-sodium or reduced-sodium tomato sauce and tomato paste. Low-sodium or reduced-sodium canned vegetables. Fruits All fresh, dried, or frozen fruit. Canned fruit in natural juice (without  added sugar). Meat and other protein foods Skinless chicken or turkey. Ground chicken or turkey. Pork with fat trimmed off. Fish and seafood. Egg whites. Dried beans, peas, or lentils. Unsalted nuts, nut butters, and seeds. Unsalted canned beans. Lean cuts of beef with fat trimmed off. Low-sodium, lean deli meat. Dairy Low-fat (1%) or fat-free (skim) milk. Fat-free, low-fat, or reduced-fat cheeses. Nonfat, low-sodium ricotta or cottage cheese. Low-fat or nonfat yogurt. Low-fat, low-sodium cheese. Fats and oils Soft margarine without trans fats. Vegetable oil. Low-fat, reduced-fat, or light mayonnaise and salad dressings (reduced-sodium). Canola, safflower, olive, soybean, and sunflower oils. Avocado. Seasoning and other foods Herbs. Spices. Seasoning mixes without salt. Unsalted popcorn and pretzels. Fat-free sweets. What foods are not recommended? The items listed may not be a complete list. Talk with your dietitian about what dietary choices are best for you. Grains Baked goods made with fat, such as croissants, muffins, or some breads. Dry pasta or rice meal packs. Vegetables Creamed or fried vegetables. Vegetables in a cheese sauce. Regular canned vegetables (not low-sodium or reduced-sodium). Regular canned tomato sauce and paste (not low-sodium or reduced-sodium). Regular tomato and vegetable juice (not low-sodium or reduced-sodium). Pickles. Olives. Fruits Canned fruit in a light or heavy syrup. Fried fruit. Fruit in cream or butter sauce. Meat and other protein foods Fatty cuts of meat. Ribs. Fried meat. Bacon. Sausage. Bologna and other processed lunch meats. Salami. Fatback. Hotdogs. Bratwurst. Salted nuts and seeds. Canned beans with added salt. Canned or smoked fish. Whole eggs or egg yolks. Chicken or turkey with skin. Dairy Whole or 2% milk, cream, and half-and-half. Whole or full-fat cream cheese. Whole-fat or sweetened yogurt. Full-fat cheese. Nondairy creamers. Whipped toppings.  Processed cheese and cheese spreads. Fats and oils Butter. Stick margarine. Lard. Shortening. Ghee. Bacon fat. Tropical oils, such as coconut, palm kernel, or palm oil. Seasoning and other foods Salted popcorn and pretzels. Onion salt, garlic salt, seasoned salt, table salt, and sea salt. Worcestershire sauce. Tartar sauce. Barbecue sauce. Teriyaki sauce. Soy sauce, including reduced-sodium. Steak sauce. Canned and packaged gravies. Fish sauce. Oyster sauce. Cocktail sauce. Horseradish that you find on the shelf. Ketchup. Mustard. Meat flavorings and tenderizers. Bouillon cubes. Hot sauce and Tabasco sauce. Premade or packaged marinades. Premade or packaged taco seasonings. Relishes. Regular salad dressings. Where to find more information:  National Heart, Lung, and Blood Institute: www.nhlbi.nih.gov  American Heart Association: www.heart.org Summary  The DASH eating plan is a healthy eating plan that has been shown to reduce high blood pressure (hypertension). It may also reduce your risk for type 2 diabetes, heart disease, and stroke.  With the DASH eating plan, you should limit salt (sodium) intake to 2,300 mg a day. If you have hypertension, you may need to reduce your sodium intake to 1,500 mg a day.  When on the DASH eating plan, aim to eat more fresh fruits and vegetables, whole grains, lean proteins, low-fat dairy, and heart-healthy fats.  Work with your health care provider or diet and nutrition specialist (dietitian) to adjust your eating plan to your   individual calorie needs. This information is not intended to replace advice given to you by your health care provider. Make sure you discuss any questions you have with your health care provider. Document Released: 12/08/2010 Document Revised: 12/13/2015 Document Reviewed: 12/13/2015 Elsevier Interactive Patient Education  2017 Elsevier Inc.    IF you received an x-ray today, you will receive an invoice from [facility].  Please contact [facility] at [phone] with questions or concerns regarding your invoice.   IF you received labwork today, you will receive an invoice from [facility]. Please contact [facility] at [phone] with questions or concerns regarding your invoice.   Our billing staff will not be able to assist you with questions regarding bills from these companies.  You will be contacted with the lab results as soon as they are available. The fastest way to get your results is to activate your My Chart account. Instructions are located on the last page of this paperwork. If you have not heard from us regarding the results in 2 weeks, please contact this office.     

## 2016-03-21 LAB — CMP14+EGFR
ALBUMIN: 4.4 g/dL (ref 3.5–5.5)
ALK PHOS: 60 IU/L (ref 39–117)
ALT: 70 IU/L — ABNORMAL HIGH (ref 0–44)
AST: 43 IU/L — ABNORMAL HIGH (ref 0–40)
Albumin/Globulin Ratio: 1.8 (ref 1.2–2.2)
BILIRUBIN TOTAL: 0.7 mg/dL (ref 0.0–1.2)
BUN / CREAT RATIO: 10 (ref 9–20)
BUN: 12 mg/dL (ref 6–24)
CO2: 25 mmol/L (ref 18–29)
CREATININE: 1.17 mg/dL (ref 0.76–1.27)
Calcium: 9.4 mg/dL (ref 8.7–10.2)
Chloride: 97 mmol/L (ref 96–106)
GFR calc Af Amer: 89 mL/min/{1.73_m2} (ref >59)
GFR calc non Af Amer: 77 mL/min/{1.73_m2} (ref >59)
GLOBULIN, TOTAL: 2.5 g/dL (ref 1.5–4.5)
Glucose: 115 mg/dL — ABNORMAL HIGH (ref 65–99)
Potassium: 3.9 mmol/L (ref 3.5–5.2)
Sodium: 137 mmol/L (ref 134–144)
Total Protein: 6.9 g/dL (ref 6.0–8.5)

## 2016-09-20 ENCOUNTER — Other Ambulatory Visit: Payer: Self-pay | Admitting: Physician Assistant

## 2016-09-20 DIAGNOSIS — I10 Essential (primary) hypertension: Secondary | ICD-10-CM

## 2017-04-11 ENCOUNTER — Encounter: Payer: Self-pay | Admitting: Physician Assistant

## 2018-06-21 IMAGING — DX DG CHEST 2V
2 series · 2 of 2 positions shown · non-contrast
Comparison: No recent prior .

CLINICAL DATA: Shortness of breath .

EXAM:
CHEST  2 VIEW

[chest pa]
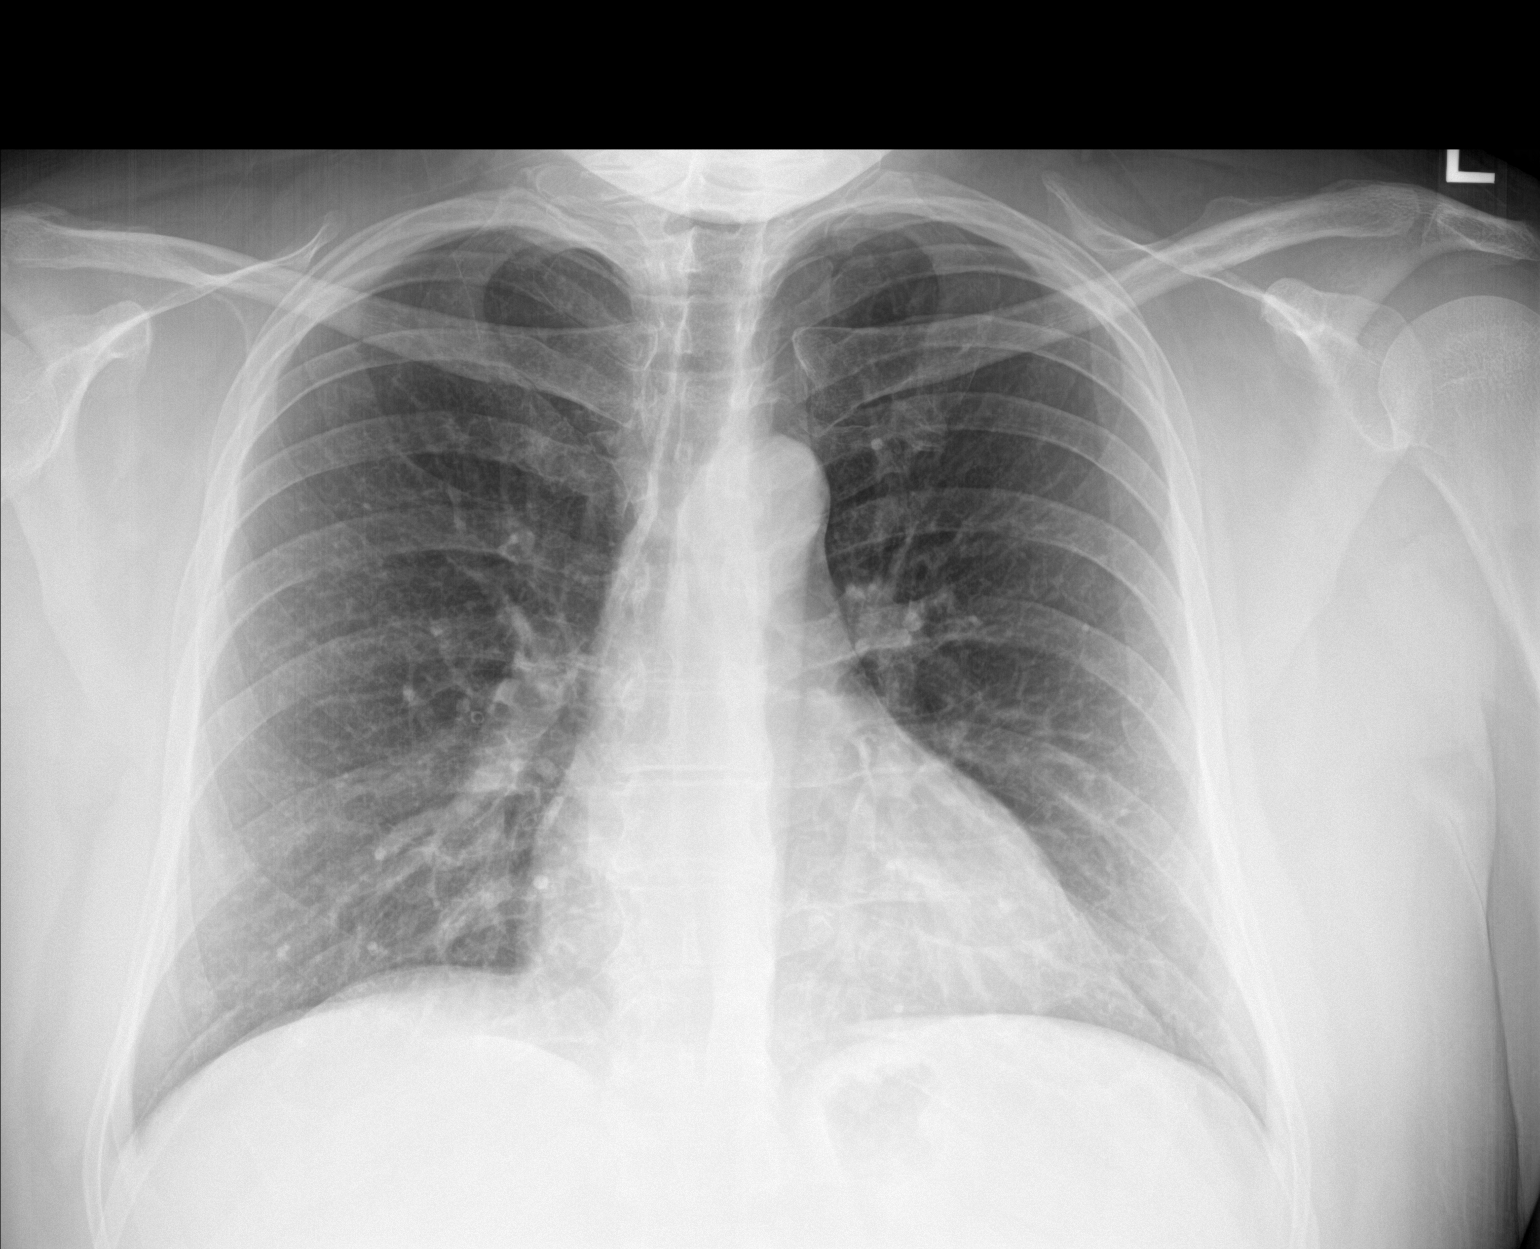

[chest lat]
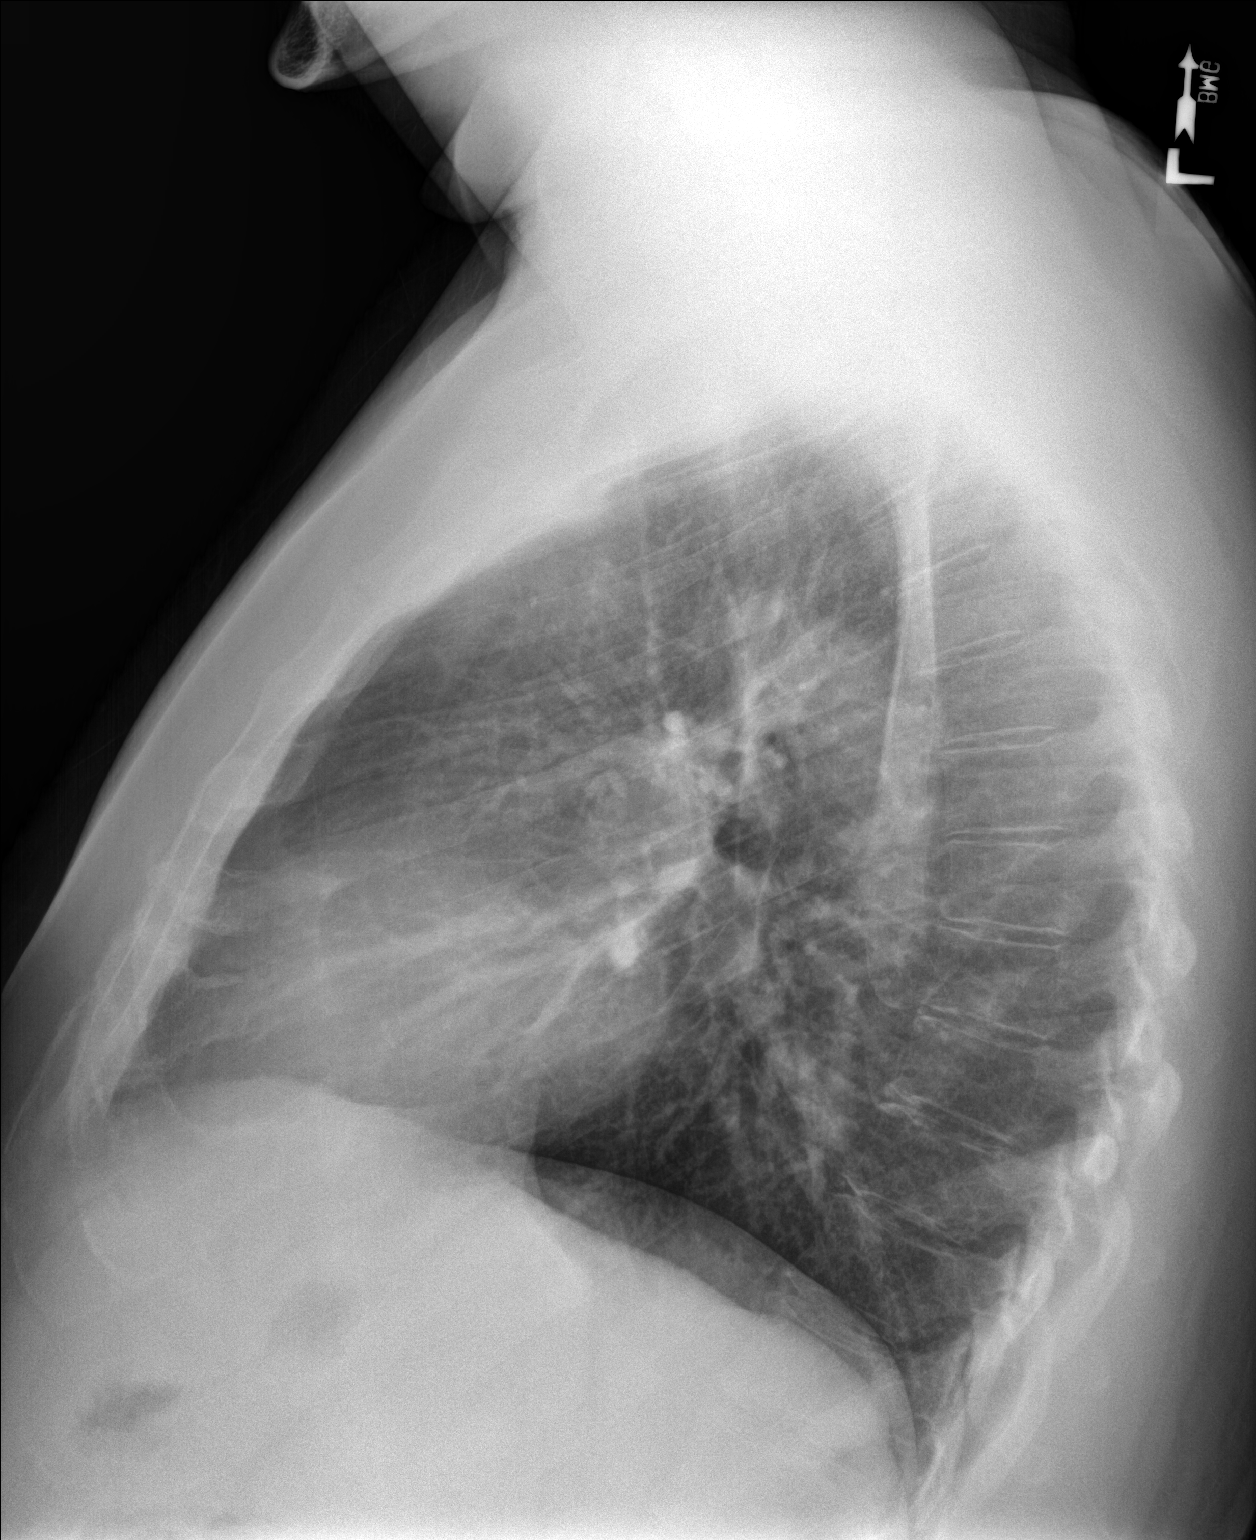

[2 of 2 positions shown; findings below may reference images not displayed]

FINDINGS: Mediastinum and hilar structures normal. Mild peribronchial cuffing.
Mild bronchitis cannot be excluded. Mild right middle lobe
subsegmental atelectasis . No pleural effusion or pneumothorax.
Heart size normal.
IMPRESSION: Mild peribronchial cuffing. Mild bronchitis cannot be excluded. Mild
right middle lobe subsegmental atelectasis .

## 2019-04-03 ENCOUNTER — Ambulatory Visit: Payer: Self-pay | Attending: Internal Medicine

## 2019-04-03 DIAGNOSIS — Z23 Encounter for immunization: Secondary | ICD-10-CM

## 2019-04-03 NOTE — Progress Notes (Signed)
   Covid-19 Vaccination Clinic  Name:  [name]    MRN: [id number] DOB: [birth year]  04/03/2019  [name] was observed post Covid-19 immunization for 15 minutes without incident. He was provided with Vaccine Information Sheet and instruction to access the V-Safe system.   [name] was instructed to call 911 with any severe reactions post vaccine: [name] Difficulty breathing  . Swelling of face and throat  . A fast heartbeat  . A bad rash all over body  . Dizziness and weakness   Immunizations Administered    Name Date Dose VIS Date Route   Pfizer COVID-19 Vaccine 04/03/2019  1:34 PM 0.3 mL 12/13/2018 Intramuscular   Manufacturer: [employer], [employer]   Lot: [id number]   NDC: [address]-8

## 2019-04-28 ENCOUNTER — Ambulatory Visit: Payer: Medicaid Other | Attending: Internal Medicine

## 2019-04-28 DIAGNOSIS — Z23 Encounter for immunization: Secondary | ICD-10-CM

## 2019-04-28 NOTE — Progress Notes (Signed)
   Covid-19 Vaccination Clinic  Name:  Dustin Richard    MRN: [id number] DOB: 1975/06/30  04/28/2019  Mr. [name] was observed post Covid-19 immunization for 15 minutes without incident. He was provided with Vaccine Information Sheet and instruction to access the V-Safe system.   Mr. [name] was instructed to call 911 with any severe reactions post vaccine: [name] Difficulty breathing  . Swelling of face and throat  . A fast heartbeat  . A bad rash all over body  . Dizziness and weakness   Immunizations Administered    Name Date Dose VIS Date Route   Pfizer COVID-19 Vaccine 04/28/2019 10:05 AM 0.3 mL 02/26/2018 Intramuscular   Manufacturer: [employer], [employer]   Lot: [id number]   NDC: [address]-0

## 2021-01-04 ENCOUNTER — Encounter (HOSPITAL_BASED_OUTPATIENT_CLINIC_OR_DEPARTMENT_OTHER): Payer: Self-pay

## 2021-01-04 ENCOUNTER — Other Ambulatory Visit: Payer: Self-pay

## 2021-01-04 ENCOUNTER — Emergency Department (HOSPITAL_BASED_OUTPATIENT_CLINIC_OR_DEPARTMENT_OTHER)
Admission: EM | Admit: 2021-01-04 | Discharge: 2021-01-04 | Disposition: A | Discharge status: Home / Self Care | Payer: Medicaid Other | Attending: Emergency Medicine | Admitting: Emergency Medicine

## 2021-01-04 DIAGNOSIS — J069 Acute upper respiratory infection, unspecified: Secondary | ICD-10-CM | POA: Insufficient documentation

## 2021-01-04 DIAGNOSIS — Z20822 Contact with and (suspected) exposure to covid-19: Secondary | ICD-10-CM | POA: Insufficient documentation

## 2021-01-04 DIAGNOSIS — Z79899 Other long term (current) drug therapy: Secondary | ICD-10-CM | POA: Insufficient documentation

## 2021-01-04 DIAGNOSIS — R030 Elevated blood-pressure reading, without diagnosis of hypertension: Secondary | ICD-10-CM

## 2021-01-04 DIAGNOSIS — H6122 Impacted cerumen, left ear: Secondary | ICD-10-CM | POA: Insufficient documentation

## 2021-01-04 DIAGNOSIS — I10 Essential (primary) hypertension: Secondary | ICD-10-CM | POA: Insufficient documentation

## 2021-01-04 LAB — RESP PANEL BY RT-PCR (FLU A&B, COVID) ARPGX2
Influenza A by PCR: NEGATIVE
Influenza B by PCR: NEGATIVE
SARS Coronavirus 2 by RT PCR: NEGATIVE

## 2021-01-04 MED ORDER — LISINOPRIL 10 MG PO TABS: 10.0000 mg | ORAL_TABLET | Freq: Every day | ORAL | 0 refills | Status: AC

## 2021-01-04 NOTE — ED Triage Notes (Signed)
Pt c/o flu like sx x 5 days-started with decreased hearing to left ear-NAD-steady gait

## 2021-01-04 NOTE — Discharge Instructions (Signed)
As we discussed I think there is a number of things going on today.  First you have some impacted earwax in the left ear, I am glad that you have some relief after we cleared it out.  If you continue to have any fullness or lingering difficulty hearing of the left ear I recommend that you follow-up with an ENT physician.  As we discussed you have elevated blood pressure today, I have sent you a new prescription of lisinopril for 30 days while you work on getting a primary care physician.  Please follow-up to establish care with a primary care physician at your earliest convenience that he can get on a regular blood pressure medication.  Additionally I think you have an upper respiratory infection of unspecified viral origin, I did recommend supportive care with fluids, Tylenol, ibuprofen for body aches, fever, as well as Sudafed, Robitussin for congestion, cough, sore throat.

## 2021-01-04 NOTE — ED Notes (Signed)
Large ear waxed removed from left ear

## 2021-01-04 NOTE — ED Provider Notes (Signed)
[facility] EMERGENCY DEPARTMENT Provider Note   CSN: [id number] Arrival date & time: 01/04/21  1138     History  Chief Complaint  Patient presents with   Cough    Dustin Richard is a 46 y.o. male with a past medical history significant for hypertension who presents with 2 problems today.  First patient reports some fullness, pressure, loss of hearing in the left ear for the last several days.  Second patient reports that he has had some cough, sore throat, runny nose for the last several days.  Patient denies fever, chills, nausea, vomiting, chest pain, shortness of breath, diarrhea, taste or smell changes.  Patient reports that he is not having any dizziness or balance issues.  Patient denies history of similar in the past.  Patient is not currently taking any medication for his hypertension, and had elevated blood pressure on arrival.   Cough Associated symptoms: ear pain       Home Medications Prior to Admission medications   Medication Sig Start Date End Date Taking? Authorizing Provider  lisinopril (ZESTRIL) 10 MG tablet Take 1 tablet (10 mg total) by mouth daily. 01/04/21  Yes Kemari Narez H, PA-C  albuterol (PROVENTIL HFA;VENTOLIN HFA) 108 (90 Base) MCG/ACT inhaler Inhale 2 puffs into the lungs every 4 (four) hours as needed for wheezing or shortness of breath (cough, shortness of breath or wheezing.). 03/20/16   Trena Platt D, PA  azithromycin (ZITHROMAX) 250 MG tablet Take 2 tabs PO x 1 dose, then 1 tab PO QD x 4 days 03/20/16   English, Judeth Cornfield D, PA  FLUoxetine (PROZAC) 20 MG tablet Take 2 tablets (40 mg total) by mouth daily. Patient not taking: Reported on 03/20/2016 08/13/15   Trena Platt D, PA  Guaifenesin [facility] MAXIMUM STRENGTH) 1200 MG TB12 Take 1 tablet (1,200 mg total) by mouth every 12 (twelve) hours as needed. 03/20/16   Trena Platt D, PA  hydrOXYzine (ATARAX/VISTARIL) 25 MG tablet Take 1-4 tablets (25-100 mg total) by mouth 3  (three) times daily as needed. Patient not taking: Reported on 03/20/2016 08/09/15   Trena Platt D, PA  ondansetron (ZOFRAN-ODT) 8 MG disintegrating tablet Take 1 tablet (8 mg total) by mouth every 8 (eight) hours as needed for nausea. Patient not taking: Reported on 03/20/2016 04/27/15   Trena Platt D, PA  propranolol ER (INDERAL LA) 80 MG 24 hr capsule Take 1 capsule (80 mg total) by mouth daily. Patient not taking: Reported on 03/20/2016 08/09/15   Trena Platt D, PA      Allergies    Shellfish allergy    Review of Systems   Review of Systems  HENT:  Positive for ear pain.   Respiratory:  Positive for cough.   All other systems reviewed and are negative.  Physical Exam Updated Vital Signs BP (!) 183/116 (BP Location: Left Arm)    Pulse 93    Temp 98 F (36.7 C) (Oral)    Resp 18    Ht 5\' 8"  (1.727 m)    Wt 103.9 kg    SpO2 100%    BMI 34.82 kg/m  Physical Exam Vitals and nursing note reviewed.  Constitutional:      General: He is not in acute distress.    Appearance: Normal appearance.  HENT:     Head: Normocephalic and atraumatic.     Right Ear: Tympanic membrane, ear canal and external ear normal. There is no impacted cerumen.     Left Ear: Tympanic membrane,  ear canal and external ear normal. There is impacted cerumen.     Ears:     Comments: Patient has significant impacted cerumen in the left ear.  He does have clear TMs bilaterally after cerumen impaction was cleared with curette, saline irrigation.    Nose: Congestion present.     Mouth/Throat:     Comments: Clear oropharynx, tonsils 1+ without exudate, no uvular deviation Eyes:     General:        Right eye: No discharge.        Left eye: No discharge.  Cardiovascular:     Rate and Rhythm: Normal rate and regular rhythm.  Pulmonary:     Effort: Pulmonary effort is normal. No respiratory distress.  Musculoskeletal:        General: No deformity.  Skin:    General: Skin is warm and dry.   Neurological:     Mental Status: He is alert and oriented to person, place, and time.  Psychiatric:        Mood and Affect: Mood normal.        Behavior: Behavior normal.    ED Results / Procedures / Treatments   Labs (all labs ordered are listed, but only abnormal results are displayed) Labs Reviewed  RESP PANEL BY RT-PCR (FLU A&B, COVID) [id number]    EKG None  Radiology No results found.  Procedures .Ear Cerumen Removal  Date/Time: 01/04/2021 5:06 PM Performed by: Olene FlossProsperi, Darel Ricketts H, PA-C Authorized by: Olene FlossProsperi, [place] H, PA-C   Consent:    Consent obtained:  Verbal   Consent given by:  Patient   Risks, benefits, and alternatives were discussed: yes     Risks discussed:  Bleeding, infection and pain   Alternatives discussed:  No treatment Universal protocol:    Procedure explained and questions answered to patient or proxy's satisfaction: yes     Patient identity confirmed:  Verbally with patient Procedure details:    Location:  L ear   Procedure type: irrigation     Procedure outcomes: cerumen removed   Post-procedure details:    Inspection:  Ear canal clear and TM intact   Hearing quality:  Improved   Procedure completion:  Tolerated    Medications Ordered in ED Medications - No data to display  ED Course/ Medical Decision Making/ A&P                           Medical Decision Making  This is an overall well-appearing patient with past medical history of hypertension who presents with left ear pain, fullness as well as cough, sore throat, runny nose.  On physical exam I do see impacted cerumen noted in the left ear.  We performed cerumen disimpaction, and patient had relief of symptoms.  Encourage follow-up with ENT if he has return of symptoms, and encouraged gentle ear cleaning at home.  See procedure note above for further information.  I personally ordered and reviewed a respiratory virus panel which showed no evidence of COVID, flu.  Patient has  stable vital signs other than elevated blood pressure, and signs and symptoms of a simple viral upper respiratory infection.  He has no purulent discharge from the nose.  I recommend supportive care, ibuprofen, Tylenol if he develops a fever.  Discussed return precautions for chest pain, shortness of breath.  Encourage patient to reestablish care with a primary care doctor in regards to his elevated blood pressure, and we will refill his  lisinopril prescription at this time for 30 days.  Patient understands and agrees to plan, return precautions given.  Final Clinical Impression(s) / ED Diagnoses Final diagnoses:  Upper respiratory tract infection, unspecified type  Impacted cerumen of left ear  Elevated blood pressure reading    Rx / DC Orders ED Discharge Orders          Ordered    lisinopril (ZESTRIL) 10 MG tablet  Daily        01/04/21 1556              Raigan Baria, [place] H, PA-C 01/04/21 1707    Virgina Norfolk, DO 01/04/21 2006

## 2023-06-27 ENCOUNTER — Emergency Department (HOSPITAL_COMMUNITY)
Admission: EM | Admit: 2023-06-27 | Discharge: 2023-06-27 | Disposition: A | Discharge status: Home / Self Care | Payer: Self-pay | Attending: Emergency Medicine | Admitting: Emergency Medicine

## 2023-06-27 ENCOUNTER — Other Ambulatory Visit: Payer: Self-pay

## 2023-06-27 ENCOUNTER — Emergency Department (HOSPITAL_COMMUNITY): Payer: Self-pay

## 2023-06-27 DIAGNOSIS — W1830XA Fall on same level, unspecified, initial encounter: Secondary | ICD-10-CM | POA: Insufficient documentation

## 2023-06-27 DIAGNOSIS — L03115 Cellulitis of right lower limb: Secondary | ICD-10-CM

## 2023-06-27 DIAGNOSIS — D649 Anemia, unspecified: Secondary | ICD-10-CM | POA: Insufficient documentation

## 2023-06-27 DIAGNOSIS — R739 Hyperglycemia, unspecified: Secondary | ICD-10-CM | POA: Insufficient documentation

## 2023-06-27 DIAGNOSIS — S80211A Abrasion, right knee, initial encounter: Secondary | ICD-10-CM | POA: Insufficient documentation

## 2023-06-27 LAB — BASIC METABOLIC PANEL WITH GFR
Anion gap: 10 (ref 5–15)
BUN: 9 mg/dL (ref 6–20)
CO2: 23 mmol/L (ref 22–32)
Calcium: 8.7 mg/dL — ABNORMAL LOW (ref 8.9–10.3)
Chloride: 102 mmol/L (ref 98–111)
Creatinine, Ser: 0.82 mg/dL (ref 0.61–1.24)
GFR, Estimated: >60 mL/min (ref >60)
Glucose, Bld: 161 mg/dL — ABNORMAL HIGH (ref 70–99)
Potassium: 3.5 mmol/L (ref 3.5–5.1)
Sodium: 135 mmol/L (ref 135–145)

## 2023-06-27 LAB — CBC WITH DIFFERENTIAL/PLATELET
Abs Immature Granulocytes: 0.03 10*3/uL (ref 0.00–0.07)
Basophils Absolute: 0 10*3/uL (ref 0.0–0.1)
Basophils Relative: 0 %
Eosinophils Absolute: 0.1 10*3/uL (ref 0.0–0.5)
Eosinophils Relative: 1 %
HCT: 37.2 % — ABNORMAL LOW (ref 39.0–52.0)
Hemoglobin: 12.6 g/dL — ABNORMAL LOW (ref 13.0–17.0)
Immature Granulocytes: 1 %
Lymphocytes Relative: 29 %
Lymphs Abs: 1.7 10*3/uL (ref 0.7–4.0)
MCH: 31 pg (ref 26.0–34.0)
MCHC: 33.9 g/dL (ref 30.0–36.0)
MCV: 91.6 fL (ref 80.0–100.0)
Monocytes Absolute: 0.5 10*3/uL (ref 0.1–1.0)
Monocytes Relative: 8 %
Neutro Abs: 3.5 10*3/uL (ref 1.7–7.7)
Neutrophils Relative %: 61 %
Platelets: 102 10*3/uL — ABNORMAL LOW (ref 150–400)
RBC: 4.06 MIL/uL — ABNORMAL LOW (ref 4.22–5.81)
RDW: 14.1 % (ref 11.5–15.5)
WBC: 5.7 10*3/uL (ref 4.0–10.5)
nRBC: 0 % (ref 0.0–0.2)

## 2023-06-27 MED ORDER — DOXYCYCLINE HYCLATE 100 MG PO CAPS: 100.0000 mg | ORAL_CAPSULE | Freq: Two times a day (BID) | ORAL | 0 refills | Status: AC

## 2023-06-27 MED ORDER — DOXYCYCLINE HYCLATE 100 MG PO TABS
100.0000 mg | ORAL_TABLET | Freq: Once | ORAL | Status: AC
  Administered 2023-06-27: 100 mg via ORAL
  Filled 2023-06-27: qty 1

## 2023-06-27 NOTE — ED Triage Notes (Addendum)
 Pt arrived reporting fall a couple days ago at work, right knee pain. Patient has wound to the knee. States felt fine initially, woke up the next day and was in excruciating pain. Denies any fever or chills. Patient has redness, swelling to right knee extending down leg

## 2023-06-27 NOTE — Discharge Instructions (Signed)
 Please read and follow all provided instructions.  Your diagnoses today include:  1. Abrasion of right knee, initial encounter   2. Cellulitis of right knee     Tests performed today include: An x-ray of the affected area - does NOT show any broken bones Complete blood cell count: Your platelet count was around 100, you need to have this monitored by primary care Basic metabolic panel: Your blood sugar was 160, you will need to have this rechecked by primary care Vital signs. See below for your results today.   Medications prescribed:  Doxycycline - antibiotic  You have been prescribed an antibiotic medicine: take the entire course of medicine even if you are feeling better. Stopping early can cause the antibiotic not to work.  Take any prescribed medications only as directed.  Home care instructions:  Follow any educational materials contained in this packet Follow R.I.C.E. Protocol: R - rest your injury  I  - use ice on injury without applying directly to skin C - compress injury with bandage or splint E - elevate the injury as much as possible  Follow-up instructions: Please follow-up with your primary care provider if you continue to have significant pain in 1 week.   Return instructions:  Please return if your toes or feet are numb or tingling, appear gray or blue, or you have severe pain (also elevate the leg and loosen splint or wrap if you were given one) Worsening pain, redness, swelling of the knee, redness streaking up your leg.  If you develop a fever. Please return to the Emergency Department if you experience worsening symptoms.  Please return if you have any other emergent concerns.  Additional Information:  Your vital signs today were: BP (!) 138/99 (BP Location: Right Arm)   Pulse (!) 105   Temp 98.3 F (36.8 C) (Oral)   Resp 18   Ht 5' 8 (1.727 m)   Wt 103.9 kg   SpO2 98%   BMI 34.83 kg/m  If your blood pressure (BP) was elevated above 135/85 this  visit, please have this repeated by your doctor within one month. --------------

## 2023-06-27 NOTE — ED Provider Notes (Signed)
 [place] EMERGENCY DEPARTMENT AT [facility] Provider Note   CSN: [id number] Arrival date & time: 06/27/23  0830     Patient presents with: Knee Pain   Dustin Richard is a 48 y.o. male.   Patient with history of migraine headaches presents to the emergency department today for evaluation of worsening right knee pain.  Patient had a fall 3 days ago onto the right knee.  He sustained a couple of abrasions over top of the kneecap.  Initially the area was just sore oat, but pain became worse yesterday.  Pain is worse with bearing weight.  The knee has felt hot and cold.  No documented fevers.  He is able to flex and extend the knee, hurts worse with extension.  Denies history of diabetes or immunocompromise.         Prior to Admission medications   Medication Sig Start Date End Date Taking? Authorizing Provider  albuterol  (PROVENTIL  HFA;VENTOLIN  HFA) 108 (90 Base) MCG/ACT inhaler Inhale 2 puffs into the lungs every 4 (four) hours as needed for wheezing or shortness of breath (cough, shortness of breath or wheezing.). 03/20/16   Isadora Krabbe D, PA  azithromycin  (ZITHROMAX ) 250 MG tablet Take 2 tabs PO x 1 dose, then 1 tab PO QD x 4 days 03/20/16   English, Krabbe D, PA  FLUoxetine  (PROZAC ) 20 MG tablet Take 2 tablets (40 mg total) by mouth daily. Patient not taking: Reported on 03/20/2016 08/13/15   Isadora Krabbe D, PA  Guaifenesin  (MUCINEX  MAXIMUM STRENGTH) 1200 MG TB12 Take 1 tablet (1,200 mg total) by mouth every 12 (twelve) hours as needed. 03/20/16   Isadora Krabbe D, PA  hydrOXYzine  (ATARAX /VISTARIL ) 25 MG tablet Take 1-4 tablets (25-100 mg total) by mouth 3 (three) times daily as needed. Patient not taking: Reported on 03/20/2016 08/09/15   Isadora Krabbe D, PA  lisinopril  (ZESTRIL ) 10 MG tablet Take 1 tablet (10 mg total) by mouth daily. 01/04/21   Prosperi, Christian H, PA-C  ondansetron  (ZOFRAN -ODT) 8 MG disintegrating tablet Take 1 tablet (8 mg total) by  mouth every 8 (eight) hours as needed for nausea. Patient not taking: Reported on 03/20/2016 04/27/15   Isadora Krabbe D, PA  propranolol  ER (INDERAL  LA) 80 MG 24 hr capsule Take 1 capsule (80 mg total) by mouth daily. Patient not taking: Reported on 03/20/2016 08/09/15   Isadora Krabbe D, PA    Allergies: Shellfish allergy    Review of Systems  Updated Vital Signs BP (!) 138/99 (BP Location: Right Arm)   Pulse (!) 105   Temp 98.3 F (36.8 C) (Oral)   Resp 18   Ht 5' 8 (1.727 m)   Wt 103.9 kg   SpO2 98%   BMI 34.83 kg/m   Physical Exam Vitals and nursing note reviewed.  Constitutional:      Appearance: He is well-developed.  HENT:     Head: Normocephalic and atraumatic.   Eyes:     Conjunctiva/sclera: Conjunctivae normal.    Cardiovascular:     Pulses: Normal pulses. No decreased pulses.   Musculoskeletal:        General: Tenderness present.     Cervical back: Normal range of motion and neck supple.     Right upper leg: No swelling or tenderness.     Right knee: Effusion (minimal) present. No bony tenderness. Normal range of motion. Tenderness present.     Left knee: No effusion. No tenderness.     Right lower leg: No edema.  Left lower leg: No edema.     Comments: Patient with 2 superficial abrasions overlying the right anterior knee.  Patient is able to flex and extend the knee fully but does have some discomfort when he extends.  Trace effusion in the knee.  Slightly warm.  No lymphangitic spread.   Skin:    General: Skin is warm and dry.   Neurological:     Mental Status: He is alert.     Sensory: No sensory deficit.     Comments: Motor, sensation, and vascular distal to the injury is fully intact.   Psychiatric:        Mood and Affect: Mood normal.        (all labs ordered are listed, but only abnormal results are displayed) Labs Reviewed  CBC WITH DIFFERENTIAL/PLATELET - Abnormal; Notable for the following components:      Result Value    RBC 4.06 (*)    Hemoglobin 12.6 (*)    HCT 37.2 (*)    Platelets 102 (*)    All other components within normal limits  BASIC METABOLIC PANEL WITH GFR - Abnormal; Notable for the following components:   Glucose, Bld 161 (*)    Calcium 8.7 (*)    All other components within normal limits    EKG: None  Radiology: DG Knee Complete 4 Views Right Result Date: 06/27/2023 CLINICAL DATA:  knee pain after fall, concern for cellulitis EXAM: RIGHT KNEE - COMPLETE 4+ VIEW COMPARISON:  None available FINDINGS: No evidence of fracture, dislocation, or joint effusion. No evidence of arthropathy or other focal bone abnormality. Soft tissues are unremarkable. IMPRESSION: Negative. Electronically Signed   By: JONETTA Faes M.D.   On: 06/27/2023 09:50     Procedures   Medications Ordered in the ED - No data to display  ED Course  Patient seen and examined. History obtained directly from patient.   Labs/EKG: Ordered CBC and BMP  Imaging: Ordered right knee x-ray  Medications/Fluids: None ordered  Most recent vital signs reviewed and are as follows: BP (!) 138/99 (BP Location: Right Arm)   Pulse (!) 105   Temp 98.3 F (36.8 C) (Oral)   Resp 18   Ht 5' 8 (1.727 m)   Wt 103.9 kg   SpO2 98%   BMI 34.83 kg/m   Initial impression: Right knee abrasion with pain, likely early cellulitis.  Patient has full range of motion of the knee leading to lower concern for septic arthritis, inflammatory arthritis.  No signs of lymphangitis.  Pain is a little out of proportion to exam.  Screening labs ordered.  //  Reassessment performed. Patient appears stable.  Labs personally reviewed and interpreted including: CBC with normal white blood cell count, mild anemia at 12.6, mildly low platelets at 102; BMP glucose 161 other was unremarkable;  Imaging personally visualized and interpreted including: X-ray agree negative for fracture or dislocation.  Reviewed pertinent lab work and imaging with patient at  bedside. Questions answered.   Most current vital signs reviewed and are as follows: BP (!) 147/111 (BP Location: Right Arm)   Pulse 77   Temp 98 F (36.7 C)   Resp 20   Ht 5' 8 (1.727 m)   Wt 103.9 kg   SpO2 99%   BMI 34.83 kg/m   Plan: Discharge to home.   Prescriptions written for: Doxycycline  Other home care instructions discussed: Rest, elevation, OTC meds for pain  ED return instructions discussed: Pt urged to return with  worsening pain, worsening swelling, expanding area of redness or streaking up extremity, fever, or any other concerns.  Urged to take complete course of antibiotics as prescribed. Pt verbalizes understanding and agrees with plan.  Follow-up instructions discussed: Patient encouraged to follow-up with their PCP in 3 days.                                    Medical Decision Making Amount and/or Complexity of Data Reviewed Labs: ordered. Radiology: ordered.  Risk Prescription drug management.   Patient with abrasion to the right knee with mild cellulitis.  Patient with some pain.  Good range of motion of the knee and do not suspect septic arthritis.  No crepitus or clinical signs of necrotizing fasciitis.  Will cover for cellulitis.  No signs of abscess.  Do not feel that advanced imaging is indicated at this time.  Patient did have mildly elevated blood sugar and low platelets which she was informed about and strongly encourage PCP follow-up to have these monitored.  He may need an A1c to evaluate for diabetes.  The patient's vital signs, pertinent lab work and imaging were reviewed and interpreted as discussed in the ED course. Hospitalization was considered for further testing, treatments, or serial exams/observation. However as patient is well-appearing, has a stable exam, and reassuring studies today, I do not feel that they warrant admission at this time. This plan was discussed with the patient who verbalizes agreement and comfort with this plan  and seems reliable and able to return to the Emergency Department with worsening or changing symptoms.       Final diagnoses:  Abrasion of right knee, initial encounter  Cellulitis of right knee    ED Discharge Orders          Ordered    doxycycline (VIBRAMYCIN) 100 MG capsule  2 times daily        06/27/23 1032               Desiderio Chew, PA-C 06/27/23 1546    Suzette Pac, MD 06/28/23 1245
